# Patient Record
Sex: Male | Born: 1965 | Race: White | Hispanic: No | Smoking: Current every day smoker
Health system: Southern US, Community
[De-identification: ages and names within clinical notes are randomized; demographics above are authoritative.]

## PROBLEM LIST (undated history)

## (undated) DIAGNOSIS — F319 Bipolar disorder, unspecified: Secondary | ICD-10-CM

## (undated) DIAGNOSIS — I10 Essential (primary) hypertension: Secondary | ICD-10-CM

## (undated) DIAGNOSIS — E162 Hypoglycemia, unspecified: Secondary | ICD-10-CM

## (undated) HISTORY — PX: HERNIA REPAIR: SHX51

---

## 2008-12-18 ENCOUNTER — Emergency Department: Payer: Self-pay | Admitting: Emergency Medicine

## 2010-11-03 ENCOUNTER — Emergency Department: Payer: Self-pay | Admitting: Emergency Medicine

## 2011-06-16 ENCOUNTER — Emergency Department: Payer: Self-pay | Admitting: Emergency Medicine

## 2011-06-16 LAB — BASIC METABOLIC PANEL
Anion Gap: 10 (ref 7–16)
Calcium, Total: 9 mg/dL (ref 8.5–10.1)
Co2: 25 mmol/L (ref 21–32)
Creatinine: 1.13 mg/dL (ref 0.60–1.30)
EGFR (African American): >60
EGFR (Non-African Amer.): >60
Glucose: 128 mg/dL — ABNORMAL HIGH (ref 65–99)
Osmolality: 284 (ref 275–301)
Potassium: 3.8 mmol/L (ref 3.5–5.1)
Sodium: 141 mmol/L (ref 136–145)

## 2011-06-16 LAB — URINALYSIS, COMPLETE
Bilirubin,UR: NEGATIVE
Blood: NEGATIVE
Ketone: NEGATIVE
Nitrite: NEGATIVE
Ph: 7 (ref 4.5–8.0)
Specific Gravity: 1.006 (ref 1.003–1.030)
Squamous Epithelial: NONE SEEN

## 2011-06-16 LAB — CBC
HCT: 46.8 % (ref 40.0–52.0)
MCH: 29.6 pg (ref 26.0–34.0)
MCHC: 34.9 g/dL (ref 32.0–36.0)
MCV: 85 fL (ref 80–100)
Platelet: 209 10*3/uL (ref 150–440)
RDW: 13.8 % (ref 11.5–14.5)
WBC: 13.2 10*3/uL — ABNORMAL HIGH (ref 3.8–10.6)

## 2011-06-16 LAB — PROTIME-INR
INR: 0.9
Prothrombin Time: 12.2 secs (ref 11.5–14.7)

## 2011-06-16 LAB — APTT: Activated PTT: 28.4 secs (ref 23.6–35.9)

## 2014-01-22 ENCOUNTER — Emergency Department: Payer: Self-pay | Admitting: Emergency Medicine

## 2014-01-31 ENCOUNTER — Emergency Department: Payer: Self-pay | Admitting: Emergency Medicine

## 2015-02-08 ENCOUNTER — Emergency Department: Payer: Self-pay

## 2015-02-08 ENCOUNTER — Encounter: Payer: Self-pay | Admitting: Emergency Medicine

## 2015-02-08 ENCOUNTER — Emergency Department
Admission: EM | Admit: 2015-02-08 | Discharge: 2015-02-08 | Disposition: A | Discharge status: Home / Self Care | Payer: Self-pay | Attending: Emergency Medicine | Admitting: Emergency Medicine

## 2015-02-08 DIAGNOSIS — X58XXXA Exposure to other specified factors, initial encounter: Secondary | ICD-10-CM | POA: Insufficient documentation

## 2015-02-08 DIAGNOSIS — J069 Acute upper respiratory infection, unspecified: Secondary | ICD-10-CM | POA: Insufficient documentation

## 2015-02-08 DIAGNOSIS — Z720 Tobacco use: Secondary | ICD-10-CM | POA: Insufficient documentation

## 2015-02-08 DIAGNOSIS — Y9289 Other specified places as the place of occurrence of the external cause: Secondary | ICD-10-CM | POA: Insufficient documentation

## 2015-02-08 DIAGNOSIS — S29012A Strain of muscle and tendon of back wall of thorax, initial encounter: Secondary | ICD-10-CM | POA: Insufficient documentation

## 2015-02-08 DIAGNOSIS — T148XXA Other injury of unspecified body region, initial encounter: Secondary | ICD-10-CM

## 2015-02-08 DIAGNOSIS — Y9389 Activity, other specified: Secondary | ICD-10-CM | POA: Insufficient documentation

## 2015-02-08 DIAGNOSIS — Y998 Other external cause status: Secondary | ICD-10-CM | POA: Insufficient documentation

## 2015-02-08 HISTORY — DX: Bipolar disorder, unspecified: F31.9

## 2015-02-08 LAB — URINALYSIS COMPLETE WITH MICROSCOPIC (ARMC ONLY)
Bilirubin Urine: NEGATIVE
Glucose, UA: NEGATIVE mg/dL
HGB URINE DIPSTICK: NEGATIVE
KETONES UR: NEGATIVE mg/dL
Nitrite: NEGATIVE
PH: 5 (ref 5.0–8.0)
PROTEIN: NEGATIVE mg/dL
Specific Gravity, Urine: 1.025 (ref 1.005–1.030)

## 2015-02-08 MED ORDER — NAPROXEN 500 MG PO TABS: 500.0000 mg | ORAL_TABLET | Freq: Two times a day (BID) | ORAL | Status: DC

## 2015-02-08 MED ORDER — NAPROXEN 500 MG PO TABS
500.0000 mg | ORAL_TABLET | Freq: Once | ORAL | Status: AC
  Administered 2015-02-08: 500 mg via ORAL

## 2015-02-08 MED ORDER — NAPROXEN 500 MG PO TABS
ORAL_TABLET | ORAL | Status: AC
  Administered 2015-02-08: 500 mg via ORAL
  Filled 2015-02-08: qty 1

## 2015-02-08 NOTE — ED Notes (Signed)
Patient transported to X-ray 

## 2015-02-08 NOTE — ED Notes (Signed)
Pt reports cough and congestion x2 weeks, reports green sputum. Pt reports right flank pain started yesterday, denies hx of kidney stones and urinary symptoms.

## 2015-02-08 NOTE — ED Provider Notes (Signed)
[facility] Emergency Department Provider Note  ____________________________________________  Time seen: 3 PM  I have reviewed the triage vital signs and the nursing notes.   HISTORY  Chief Complaint URI and Flank Pain    HPI Thomas [name] is a 49 y.o. male who presents with cough and congestion for approximately 2 weeks. He says occasionally his sputum looks green but typically it is yellow. He denies fevers chills. No shortness of breath. No recent travel. No pleurisy. He also complains of new right mid back pain that is worse with movement. He feels he injured it from coughing. He has no abdominal pain. No nausea no vomiting     Past Medical History  Diagnosis Date  . Bipolar 1 disorder ([place])     There are no active problems to display for this patient.   Past Surgical History  Procedure Laterality Date  . Hernia repair      Current Outpatient Rx  Name  Route  Sig  Dispense  Refill  . naproxen (NAPROSYN) 500 MG tablet   Oral   Take 1 tablet (500 mg total) by mouth 2 (two) times daily with a meal.   20 tablet   2     Allergies Sulfa antibiotics  No family history on file.  Social History Social History  Substance Use Topics  . Smoking status: Current Every Day Smoker    Types: Cigarettes  . Smokeless tobacco: None  . Alcohol Use: No    Review of Systems  Constitutional: Negative for fever. Eyes: Negative for visual changes. ENT: Negative for sore throat Cardiovascular: Negative for chest pain. Respiratory: Negative for shortness of breath. For cough Gastrointestinal: Negative for abdominal pain, vomiting and diarrhea. Genitourinary: Negative for dysuria. Musculoskeletal: Positive for back pain Skin: Negative for rash. Neurological: Negative for headaches or focal weakness Psychiatric: No anxiety    ____________________________________________   PHYSICAL EXAM:  VITAL SIGNS: ED Triage Vitals  Enc Vitals Group      BP 02/08/15 1420 135/86 mmHg     Pulse Rate 02/08/15 1420 67     Resp 02/08/15 1420 20     Temp 02/08/15 1420 98 F (36.7 C)     Temp Source 02/08/15 1420 Oral     SpO2 02/08/15 1420 97 %     Weight 02/08/15 1420 199 lb (90.266 kg)     Height 02/08/15 1420 6' (1.829 m)     Head Cir --      Peak Flow --      Pain Score 02/08/15 1421 10     Pain Loc --      Pain Edu? --      Excl. in [place]? --      Constitutional: Alert and oriented. Well appearing and in no distress. Eyes: Conjunctivae are normal.  ENT   Head: Normocephalic and atraumatic.   Mouth/Throat: Mucous membranes are moist. Cardiovascular: Normal rate, regular rhythm. Normal and symmetric distal pulses are present in all extremities. No murmurs, rubs, or gallops. Respiratory: Normal respiratory effort without tachypnea nor retractions. Breath sounds are clear and equal bilaterally.  Gastrointestinal: Soft and non-tender in all quadrants. No distention. There is no CVA tenderness. Genitourinary: deferred Musculoskeletal: Nontender with normal range of motion in all extremities. No lower extremity tenderness nor edema. Patient with tenderness to palpation inferior to the right scapula consistent with muscle strain Neurologic:  Normal speech and language. No gross focal neurologic deficits are appreciated. Skin:  Skin is warm, dry and intact. No  rash noted. Psychiatric: Mood and affect are normal. Patient exhibits appropriate insight and judgment.  ____________________________________________    LABS (pertinent positives/negatives)  Labs Reviewed  URINALYSIS COMPLETEWITH MICROSCOPIC ([place] ONLY) - Abnormal; Notable for the following:    Color, Urine YELLOW (*)    APPearance CLEAR (*)    Leukocytes, UA TRACE (*)    Bacteria, UA RARE (*)    Squamous Epithelial / LPF 0-5 (*)    All other components within normal limits     ____________________________________________   EKG  None  ____________________________________________    RADIOLOGY I have personally reviewed any xrays that were ordered on this patient: Chest x-ray unremarkable  ____________________________________________   PROCEDURES  Procedure(s) performed: none  Critical Care performed: none  ____________________________________________   INITIAL IMPRESSION / ASSESSMENT AND PLAN / ED COURSE  Pertinent labs & imaging results that were available during my care of the patient were reviewed by me and considered in my medical decision making (see chart for details).  Patient with history of present illness consistent with upper respiratory infection now with muscle strain. Chest x-ray is unremarkable, vitals are normal. Exam is benign except for tenderness beneath the right scapula consistent with muscle strain. We will treat with NSAIDs and PCP follow-up  ____________________________________________   FINAL CLINICAL IMPRESSION(S) / ED DIAGNOSES  Final diagnoses:  Upper respiratory infection  Muscle strain     Lavonia Drafts, MD 02/08/15 1643

## 2015-02-08 NOTE — Discharge Instructions (Signed)
Upper Respiratory Infection, Adult Most upper respiratory infections (URIs) are a viral infection of the air passages leading to the lungs. A URI affects the nose, throat, and upper air passages. The most common type of URI is nasopharyngitis and is typically referred to as "the common cold." URIs run their course and usually go away on their own. Most of the time, a URI does not require medical attention, but sometimes a bacterial infection in the upper airways can follow a viral infection. This is called a secondary infection. Sinus and middle ear infections are common types of secondary upper respiratory infections. Bacterial pneumonia can also complicate a URI. A URI can worsen asthma and chronic obstructive pulmonary disease (COPD). Sometimes, these complications can require emergency medical care and may be life threatening.  CAUSES Almost all URIs are caused by viruses. A virus is a type of germ and can spread from one person to another.  RISKS FACTORS You may be at risk for a URI if:   You smoke.   You have chronic heart or lung disease.  You have a weakened defense (immune) system.   You are very young or very old.   You have nasal allergies or asthma.  You work in crowded or poorly ventilated areas.  You work in health care facilities or schools. SIGNS AND SYMPTOMS  Symptoms typically develop 2-3 days after you come in contact with a cold virus. Most viral URIs last 7-10 days. However, viral URIs from the influenza virus (flu virus) can last 14-18 days and are typically more severe. Symptoms may include:   Runny or stuffy (congested) nose.   Sneezing.   Cough.   Sore throat.   Headache.   Fatigue.   Fever.   Loss of appetite.   Pain in your forehead, behind your eyes, and over your cheekbones (sinus pain).  Muscle aches.  DIAGNOSIS  Your health care provider may diagnose a URI by:  Physical exam.  Tests to check that your symptoms are not due to  another condition such as:  Strep throat.  Sinusitis.  Pneumonia.  Asthma. TREATMENT  A URI goes away on its own with time. It cannot be cured with medicines, but medicines may be prescribed or recommended to relieve symptoms. Medicines may help:  Reduce your fever.  Reduce your cough.  Relieve nasal congestion. HOME CARE INSTRUCTIONS   Take medicines only as directed by your health care provider.   Gargle warm saltwater or take cough drops to comfort your throat as directed by your health care provider.  Use a warm mist humidifier or inhale steam from a shower to increase air moisture. This may make it easier to breathe.  Drink enough fluid to keep your urine clear or pale yellow.   Eat soups and other clear broths and maintain good nutrition.   Rest as needed.   Return to work when your temperature has returned to normal or as your health care provider advises. You may need to stay home longer to avoid infecting others. You can also use a face mask and careful hand washing to prevent spread of the virus.  Increase the usage of your inhaler if you have asthma.   Do not use any tobacco products, including cigarettes, chewing tobacco, or electronic cigarettes. If you need help quitting, ask your health care provider. PREVENTION  The best way to protect yourself from getting a cold is to practice good hygiene.   Avoid oral or hand contact with people with cold   symptoms.   Wash your hands often if contact occurs.  There is no clear evidence that vitamin C, vitamin E, echinacea, or exercise reduces the chance of developing a cold. However, it is always recommended to get plenty of rest, exercise, and practice good nutrition.  SEEK MEDICAL CARE IF:   You are getting worse rather than better.   Your symptoms are not controlled by medicine.   You have chills.  You have worsening shortness of breath.  You have brown or red mucus.  You have yellow or brown nasal  discharge.  You have pain in your face, especially when you bend forward.  You have a fever.  You have swollen neck glands.  You have pain while swallowing.  You have white areas in the back of your throat. SEEK IMMEDIATE MEDICAL CARE IF:   You have severe or persistent:  Headache.  Ear pain.  Sinus pain.  Chest pain.  You have chronic lung disease and any of the following:  Wheezing.  Prolonged cough.  Coughing up blood.  A change in your usual mucus.  You have a stiff neck.  You have changes in your:  Vision.  Hearing.  Thinking.  Mood. MAKE SURE YOU:   Understand these instructions.  Will watch your condition.  Will get help right away if you are not doing well or get worse.   This information is not intended to replace advice given to you by your health care provider. Make sure you discuss any questions you have with your health care provider.   Document Released: 10/12/2000 Document Revised: 09/02/2014 Document Reviewed: 07/24/2013 Elsevier Interactive Patient Education 2016 [employer]  

## 2015-02-08 NOTE — ED Notes (Signed)
MD Kinner at bedside  

## 2015-05-20 ENCOUNTER — Encounter: Payer: Self-pay | Admitting: Urgent Care

## 2015-05-20 DIAGNOSIS — F17210 Nicotine dependence, cigarettes, uncomplicated: Secondary | ICD-10-CM | POA: Insufficient documentation

## 2015-05-20 DIAGNOSIS — K1379 Other lesions of oral mucosa: Secondary | ICD-10-CM | POA: Insufficient documentation

## 2015-05-20 DIAGNOSIS — H6692 Otitis media, unspecified, left ear: Secondary | ICD-10-CM | POA: Insufficient documentation

## 2015-05-20 DIAGNOSIS — Z791 Long term (current) use of non-steroidal anti-inflammatories (NSAID): Secondary | ICD-10-CM | POA: Diagnosis not present

## 2015-05-20 DIAGNOSIS — H9202 Otalgia, left ear: Secondary | ICD-10-CM | POA: Diagnosis present

## 2015-05-20 MED ORDER — DEXAMETHASONE 1 MG/ML PO CONC: 10.0000 mg | Freq: Once | ORAL | Status: DC

## 2015-05-20 MED ORDER — HYDROCODONE-ACETAMINOPHEN 5-325 MG PO TABS
2.0000 | ORAL_TABLET | Freq: Once | ORAL | Status: AC
  Administered 2015-05-21: 2 via ORAL
  Filled 2015-05-20: qty 2

## 2015-05-20 NOTE — ED Notes (Signed)
Patient presents with c/o a LEFT otalgia x 2 days. Pain and swelling has progressed to where he feels like his throat is swelling - states, "It feels like something is hanging down back there". Patient noted to have patent airway and is able to maintain his oral secretions without difficulties. There is no reported changes in the quality of patient's voice. No increased WOB noted in triage. Patient advising that he has had a fever; unable to report tmax.

## 2015-05-21 ENCOUNTER — Emergency Department
Admission: EM | Admit: 2015-05-21 | Discharge: 2015-05-21 | Disposition: A | Discharge status: Home / Self Care | Payer: Medicaid Other | Attending: Emergency Medicine | Admitting: Emergency Medicine

## 2015-05-21 DIAGNOSIS — H6692 Otitis media, unspecified, left ear: Secondary | ICD-10-CM

## 2015-05-21 MED ORDER — AMOXICILLIN-POT CLAVULANATE 875-125 MG PO TABS
1.0000 | ORAL_TABLET | Freq: Once | ORAL | Status: AC
  Administered 2015-05-21: 1 via ORAL
  Filled 2015-05-21: qty 1

## 2015-05-21 MED ORDER — DEXAMETHASONE SODIUM PHOSPHATE 10 MG/ML IJ SOLN
10.0000 mg | Freq: Once | INTRAMUSCULAR | Status: AC
  Administered 2015-05-21: 10 mg via INTRAVENOUS

## 2015-05-21 MED ORDER — IBUPROFEN 800 MG PO TABS
800.0000 mg | ORAL_TABLET | Freq: Once | ORAL | Status: AC
  Administered 2015-05-21: 800 mg via ORAL
  Filled 2015-05-21: qty 1

## 2015-05-21 MED ORDER — AMOXICILLIN-POT CLAVULANATE 875-125 MG PO TABS: 1.0000 | ORAL_TABLET | Freq: Two times a day (BID) | ORAL | Status: AC

## 2015-05-21 MED ORDER — DEXAMETHASONE SODIUM PHOSPHATE 10 MG/ML IJ SOLN
INTRAMUSCULAR | Status: AC
  Administered 2015-05-21: 10 mg via INTRAVENOUS
  Filled 2015-05-21: qty 1

## 2015-05-21 NOTE — ED Notes (Signed)
Patient medicated in triage with steroids and pain medication for reported symptoms. Patient also reassessed at this time. While there is slight swelling noted, it is not significant when compared contralaterally. Patient continues to maintain a patent airway; BBS CTA; able to handle oral secretions without difficulties. Sats 98% on RA. Patient advised to make stat desk staff aware if symptoms worsen while in the lobby; verbalized understanding.

## 2015-05-21 NOTE — Discharge Instructions (Signed)

## 2015-05-21 NOTE — ED Provider Notes (Signed)
[facility] Emergency Department Provider Note  ____________________________________________  Time seen: 3:45 AM  I have reviewed the triage vital signs and the nursing notes.   HISTORY  Chief Complaint Otalgia and Fever    HPI Thomas [name] is a 50 y.o. male presents with left earache 2 days accompanied by a "feels like something is hanging in the back of my throat". Patient states that he's had a fever at home however afebrile here at 98.4 and presentation. Patient admits to smoking 1 1/2-2 packs of cigarettes per day     Past Medical History  Diagnosis Date  . Bipolar 1 disorder ([place])     There are no active problems to display for this patient.   Past Surgical History  Procedure Laterality Date  . Hernia repair      Current Outpatient Rx  Name  Route  Sig  Dispense  Refill  . naproxen (NAPROSYN) 500 MG tablet   Oral   Take 1 tablet (500 mg total) by mouth 2 (two) times daily with a meal.   20 tablet   2     Allergies Sulfa antibiotics  No family history on file.  Social History Social History  Substance Use Topics  . Smoking status: Current Every Day Smoker    Types: Cigarettes  . Smokeless tobacco: None  . Alcohol Use: No    Review of Systems  Constitutional: Positive for fever. Eyes: Negative for visual changes. ENT: Negative for sore throat. Positive for left earache Cardiovascular: Negative for chest pain. Respiratory: Negative for shortness of breath. Gastrointestinal: Negative for abdominal pain, vomiting and diarrhea. Genitourinary: Negative for dysuria. Musculoskeletal: Negative for back pain. Skin: Negative for rash. Neurological: Negative for headaches, focal weakness or numbness.   10-point ROS otherwise negative.  ____________________________________________   PHYSICAL EXAM:  VITAL SIGNS: ED Triage Vitals  Enc Vitals Group     BP 05/20/15 2346 151/89 mmHg     Pulse Rate 05/20/15 2346 68     Resp  05/20/15 2346 18     Temp 05/20/15 2346 98.4 F (36.9 C)     Temp Source 05/20/15 2346 Oral     SpO2 05/20/15 2346 98 %     Weight 05/20/15 2346 201 lb (91.173 kg)     Height 05/20/15 2346 6' (1.829 m)     Head Cir --      Peak Flow --      Pain Score 05/20/15 2346 10     Pain Loc --      Pain Edu? --      Excl. in [place]? --      Constitutional: Alert and oriented. Well appearing and in no distress. Eyes: Conjunctivae are normal. PERRL. Normal extraocular movements. ENT   Head: Normocephalic and atraumatic.   Nose: No congestion/rhinnorhea.   Mouth/Throat: Mucous membranes are moist. Uvula hydrops   Neck: No stridor. Ears: Left TM erythema with bulging and cloudy fluid noted posteriorly Hematological/Lymphatic/Immunilogical: No cervical lymphadenopathy. Cardiovascular: Normal rate, regular rhythm. Normal and symmetric distal pulses are present in all extremities. No murmurs, rubs, or gallops. Respiratory: Normal respiratory effort without tachypnea nor retractions. Breath sounds are clear and equal bilaterally. No wheezes/rales/rhonchi. Gastrointestinal: Soft and nontender. No distention. There is no CVA tenderness. Genitourinary: deferred Musculoskeletal: Nontender with normal range of motion in all extremities. No joint effusions.  No lower extremity tenderness nor edema. Neurologic:  Normal speech and language. No gross focal neurologic deficits are appreciated. Speech is normal.  Skin:  Skin is warm, dry and intact. No rash noted. Psychiatric: Mood and affect are normal. Speech and behavior are normal. Patient exhibits appropriate insight and judgment.    INITIAL IMPRESSION / ASSESSMENT AND PLAN / ED COURSE  Pertinent labs & imaging results that were available during my care of the patient were reviewed by me and considered in my medical decision making (see chart for details).  History of physical exam consistent with left otitis media as such patient received  Augmentin 875 mg will be prescribed same at home. Patient also noted to have uvular hydrops  ____________________________________________   FINAL CLINICAL IMPRESSION(S) / ED DIAGNOSES  Final diagnoses:  Acute left otitis media, recurrence not specified, unspecified otitis media type  Uvula hydrops    Gregor Hams, MD 05/21/15 0430

## 2015-05-22 ENCOUNTER — Observation Stay
Admission: EM | Admit: 2015-05-22 | Discharge: 2015-05-23 | Disposition: A | Discharge status: Home / Self Care | Payer: Medicaid Other | Attending: Internal Medicine | Admitting: Internal Medicine

## 2015-05-22 ENCOUNTER — Encounter
Admission: EM | Disposition: A | Discharge status: Home / Self Care | Payer: Self-pay | Source: Home / Self Care | Attending: Internal Medicine

## 2015-05-22 ENCOUNTER — Inpatient Hospital Stay: Payer: Medicaid Other | Admitting: Certified Registered"

## 2015-05-22 ENCOUNTER — Emergency Department: Payer: Medicaid Other

## 2015-05-22 DIAGNOSIS — R030 Elevated blood-pressure reading, without diagnosis of hypertension: Secondary | ICD-10-CM | POA: Diagnosis not present

## 2015-05-22 DIAGNOSIS — Z882 Allergy status to sulfonamides status: Secondary | ICD-10-CM | POA: Insufficient documentation

## 2015-05-22 DIAGNOSIS — R509 Fever, unspecified: Secondary | ICD-10-CM | POA: Insufficient documentation

## 2015-05-22 DIAGNOSIS — Z9889 Other specified postprocedural states: Secondary | ICD-10-CM | POA: Insufficient documentation

## 2015-05-22 DIAGNOSIS — R1310 Dysphagia, unspecified: Secondary | ICD-10-CM | POA: Insufficient documentation

## 2015-05-22 DIAGNOSIS — F17210 Nicotine dependence, cigarettes, uncomplicated: Secondary | ICD-10-CM | POA: Diagnosis not present

## 2015-05-22 DIAGNOSIS — Z8249 Family history of ischemic heart disease and other diseases of the circulatory system: Secondary | ICD-10-CM | POA: Insufficient documentation

## 2015-05-22 DIAGNOSIS — F319 Bipolar disorder, unspecified: Secondary | ICD-10-CM | POA: Diagnosis not present

## 2015-05-22 DIAGNOSIS — J36 Peritonsillar abscess: Principal | ICD-10-CM | POA: Diagnosis present

## 2015-05-22 DIAGNOSIS — J390 Retropharyngeal and parapharyngeal abscess: Secondary | ICD-10-CM

## 2015-05-22 DIAGNOSIS — D72829 Elevated white blood cell count, unspecified: Secondary | ICD-10-CM | POA: Insufficient documentation

## 2015-05-22 HISTORY — PX: INCISION AND DRAINAGE OF PERITONSILLAR ABCESS: SHX6257

## 2015-05-22 LAB — COMPREHENSIVE METABOLIC PANEL
ALBUMIN: 3.9 g/dL (ref 3.5–5.0)
ALT: 14 U/L — ABNORMAL LOW (ref 17–63)
AST: 12 U/L — AB (ref 15–41)
Alkaline Phosphatase: 79 U/L (ref 38–126)
Anion gap: 8 (ref 5–15)
BILIRUBIN TOTAL: 0.5 mg/dL (ref 0.3–1.2)
BUN: 15 mg/dL (ref 6–20)
CO2: 25 mmol/L (ref 22–32)
Calcium: 9.1 mg/dL (ref 8.9–10.3)
Chloride: 102 mmol/L (ref 101–111)
Creatinine, Ser: 1.01 mg/dL (ref 0.61–1.24)
GFR calc Af Amer: >60 mL/min (ref >60)
GFR calc non Af Amer: >60 mL/min (ref >60)
GLUCOSE: 124 mg/dL — AB (ref 65–99)
POTASSIUM: 3.2 mmol/L — AB (ref 3.5–5.1)
Sodium: 135 mmol/L (ref 135–145)
TOTAL PROTEIN: 7.9 g/dL (ref 6.5–8.1)

## 2015-05-22 LAB — CBC
HEMATOCRIT: 46.4 % (ref 40.0–52.0)
HEMOGLOBIN: 15.4 g/dL (ref 13.0–18.0)
MCH: 26.6 pg (ref 26.0–34.0)
MCHC: 33.2 g/dL (ref 32.0–36.0)
MCV: 80.1 fL (ref 80.0–100.0)
Platelets: 208 10*3/uL (ref 150–440)
RBC: 5.79 MIL/uL (ref 4.40–5.90)
RDW: 14.7 % — AB (ref 11.5–14.5)
WBC: 18.9 10*3/uL — AB (ref 3.8–10.6)

## 2015-05-22 SURGERY — INCISION AND DRAINAGE, ABSCESS, PERITONSILLAR
Anesthesia: General | Wound class: Dirty or Infected

## 2015-05-22 MED ORDER — ONDANSETRON HCL 4 MG/2ML IJ SOLN
INTRAMUSCULAR | Status: DC | PRN
  Administered 2015-05-22: 4 mg via INTRAVENOUS

## 2015-05-22 MED ORDER — MORPHINE SULFATE (PF) 2 MG/ML IV SOLN: 2.0000 mg | INTRAVENOUS | Status: DC | PRN

## 2015-05-22 MED ORDER — CLINDAMYCIN PHOSPHATE 900 MG/50ML IV SOLN
900.0000 mg | Freq: Once | INTRAVENOUS | Status: AC
  Administered 2015-05-22: 900 mg via INTRAVENOUS
  Filled 2015-05-22: qty 50

## 2015-05-22 MED ORDER — MORPHINE SULFATE (PF) 4 MG/ML IV SOLN
INTRAVENOUS | Status: AC
  Administered 2015-05-22: 4 mg via INTRAVENOUS
  Filled 2015-05-22: qty 1

## 2015-05-22 MED ORDER — VENLAFAXINE HCL ER 75 MG PO CP24: 75.0000 mg | ORAL_CAPSULE | Freq: Every morning | ORAL | Status: DC

## 2015-05-22 MED ORDER — ACETAMINOPHEN 160 MG/5ML PO SOLN
650.0000 mg | ORAL | Status: DC | PRN
  Filled 2015-05-22: qty 20.3

## 2015-05-22 MED ORDER — HYDROCODONE-ACETAMINOPHEN 7.5-325 MG/15ML PO SOLN: 10.0000 mL | ORAL | Status: DC | PRN

## 2015-05-22 MED ORDER — SUGAMMADEX SODIUM 200 MG/2ML IV SOLN
INTRAVENOUS | Status: DC | PRN
  Administered 2015-05-22: 180 mg via INTRAVENOUS

## 2015-05-22 MED ORDER — SODIUM CHLORIDE 0.9 % IV SOLN
3.0000 g | Freq: Four times a day (QID) | INTRAVENOUS | Status: DC
  Administered 2015-05-22 – 2015-05-23 (×3): 3 g via INTRAVENOUS
  Filled 2015-05-22 (×6): qty 3

## 2015-05-22 MED ORDER — DEXAMETHASONE SODIUM PHOSPHATE 10 MG/ML IJ SOLN: 10.0000 mg | Freq: Three times a day (TID) | INTRAMUSCULAR | Status: DC

## 2015-05-22 MED ORDER — ONDANSETRON HCL 4 MG/2ML IJ SOLN
4.0000 mg | Freq: Once | INTRAMUSCULAR | Status: AC
  Administered 2015-05-22: 4 mg via INTRAVENOUS
  Filled 2015-05-22: qty 2

## 2015-05-22 MED ORDER — HYDROMORPHONE HCL 1 MG/ML IJ SOLN
0.2500 mg | INTRAMUSCULAR | Status: DC | PRN
  Administered 2015-05-22 (×2): 0.25 mg via INTRAVENOUS

## 2015-05-22 MED ORDER — ONDANSETRON HCL 4 MG/2ML IJ SOLN: 4.0000 mg | Freq: Once | INTRAMUSCULAR | Status: DC | PRN

## 2015-05-22 MED ORDER — SUCCINYLCHOLINE CHLORIDE 20 MG/ML IJ SOLN
INTRAMUSCULAR | Status: DC | PRN
  Administered 2015-05-22: 40 mg via INTRAVENOUS
  Administered 2015-05-22: 100 mg via INTRAVENOUS

## 2015-05-22 MED ORDER — LIDOCAINE HCL (CARDIAC) 20 MG/ML IV SOLN
INTRAVENOUS | Status: DC | PRN
  Administered 2015-05-22: 60 mg via INTRAVENOUS

## 2015-05-22 MED ORDER — FENTANYL CITRATE (PF) 100 MCG/2ML IJ SOLN: 25.0000 ug | INTRAMUSCULAR | Status: DC | PRN

## 2015-05-22 MED ORDER — MORPHINE SULFATE (PF) 2 MG/ML IV SOLN
2.0000 mg | Freq: Once | INTRAVENOUS | Status: AC
  Administered 2015-05-22: 2 mg via INTRAVENOUS

## 2015-05-22 MED ORDER — ONDANSETRON HCL 4 MG PO TABS
4.0000 mg | ORAL_TABLET | Freq: Four times a day (QID) | ORAL | Status: DC | PRN
Start: 2015-05-22 — End: 2015-05-23

## 2015-05-22 MED ORDER — KCL IN DEXTROSE-NACL 20-5-0.45 MEQ/L-%-% IV SOLN
INTRAVENOUS | Status: DC
  Administered 2015-05-22 – 2015-05-23 (×2): via INTRAVENOUS
  Filled 2015-05-22 (×5): qty 1000

## 2015-05-22 MED ORDER — LABETALOL HCL 5 MG/ML IV SOLN
5.0000 mg | Freq: Once | INTRAVENOUS | Status: AC
  Administered 2015-05-22: 5 mg via INTRAVENOUS

## 2015-05-22 MED ORDER — ONDANSETRON HCL 4 MG/2ML IJ SOLN: 4.0000 mg | Freq: Four times a day (QID) | INTRAMUSCULAR | Status: DC | PRN

## 2015-05-22 MED ORDER — MORPHINE SULFATE (PF) 2 MG/ML IV SOLN
INTRAVENOUS | Status: AC
  Filled 2015-05-22: qty 1

## 2015-05-22 MED ORDER — LABETALOL HCL 5 MG/ML IV SOLN
INTRAVENOUS | Status: AC
  Administered 2015-05-22: 5 mg via INTRAVENOUS
  Filled 2015-05-22: qty 4

## 2015-05-22 MED ORDER — HYDROMORPHONE HCL 1 MG/ML IJ SOLN
INTRAMUSCULAR | Status: AC
  Administered 2015-05-22: 0.25 mg via INTRAVENOUS
  Filled 2015-05-22: qty 1

## 2015-05-22 MED ORDER — ACETAMINOPHEN 650 MG RE SUPP
650.0000 mg | Freq: Four times a day (QID) | RECTAL | Status: DC | PRN
Start: 2015-05-22 — End: 2015-05-23

## 2015-05-22 MED ORDER — DOCUSATE SODIUM 100 MG PO CAPS
100.0000 mg | ORAL_CAPSULE | Freq: Two times a day (BID) | ORAL | Status: DC
  Administered 2015-05-22 – 2015-05-23 (×2): 100 mg via ORAL
  Filled 2015-05-22 (×2): qty 1

## 2015-05-22 MED ORDER — PROPOFOL 10 MG/ML IV BOLUS
INTRAVENOUS | Status: DC | PRN
  Administered 2015-05-22: 170 mg via INTRAVENOUS

## 2015-05-22 MED ORDER — FENTANYL CITRATE (PF) 100 MCG/2ML IJ SOLN
INTRAMUSCULAR | Status: DC | PRN
  Administered 2015-05-22 (×2): 50 ug via INTRAVENOUS

## 2015-05-22 MED ORDER — CLINDAMYCIN PHOSPHATE 600 MG/50ML IV SOLN
600.0000 mg | Freq: Three times a day (TID) | INTRAVENOUS | Status: DC
  Administered 2015-05-22 – 2015-05-23 (×3): 600 mg via INTRAVENOUS
  Filled 2015-05-22 (×5): qty 50

## 2015-05-22 MED ORDER — ONDANSETRON HCL 4 MG/5ML PO SOLN
4.0000 mg | Freq: Four times a day (QID) | ORAL | Status: DC | PRN
  Filled 2015-05-22: qty 5

## 2015-05-22 MED ORDER — DEXAMETHASONE SODIUM PHOSPHATE 10 MG/ML IJ SOLN
10.0000 mg | Freq: Once | INTRAMUSCULAR | Status: AC
  Administered 2015-05-22: 10 mg via INTRAVENOUS
  Filled 2015-05-22: qty 1

## 2015-05-22 MED ORDER — DEXAMETHASONE SODIUM PHOSPHATE 10 MG/ML IJ SOLN
10.0000 mg | Freq: Three times a day (TID) | INTRAMUSCULAR | Status: DC
  Administered 2015-05-22 – 2015-05-23 (×3): 10 mg via INTRAVENOUS
  Filled 2015-05-22 (×3): qty 1

## 2015-05-22 MED ORDER — OXYCODONE HCL 5 MG PO TABS
5.0000 mg | ORAL_TABLET | ORAL | Status: DC | PRN
  Administered 2015-05-22: 5 mg via ORAL
  Filled 2015-05-22: qty 1

## 2015-05-22 MED ORDER — ROCURONIUM BROMIDE 100 MG/10ML IV SOLN
INTRAVENOUS | Status: DC | PRN
  Administered 2015-05-22: 10 mg via INTRAVENOUS

## 2015-05-22 MED ORDER — VENLAFAXINE HCL ER 75 MG PO CP24
75.0000 mg | ORAL_CAPSULE | Freq: Two times a day (BID) | ORAL | Status: DC
  Administered 2015-05-22 – 2015-05-23 (×2): 75 mg via ORAL
  Filled 2015-05-22 (×2): qty 1

## 2015-05-22 MED ORDER — MORPHINE SULFATE (PF) 2 MG/ML IV SOLN
2.0000 mg | Freq: Once | INTRAVENOUS | Status: AC
  Administered 2015-05-22: 2 mg via INTRAVENOUS
  Filled 2015-05-22: qty 1

## 2015-05-22 MED ORDER — MIDAZOLAM HCL 5 MG/5ML IJ SOLN
INTRAMUSCULAR | Status: DC | PRN
  Administered 2015-05-22 (×2): 1 mg via INTRAVENOUS

## 2015-05-22 MED ORDER — POLYETHYLENE GLYCOL 3350 17 G PO PACK: 17.0000 g | PACK | Freq: Every day | ORAL | Status: DC | PRN

## 2015-05-22 MED ORDER — MORPHINE SULFATE (PF) 4 MG/ML IV SOLN
4.0000 mg | Freq: Once | INTRAVENOUS | Status: AC
  Administered 2015-05-22: 4 mg via INTRAVENOUS

## 2015-05-22 MED ORDER — GLYCOPYRROLATE 0.2 MG/ML IJ SOLN
INTRAMUSCULAR | Status: DC | PRN
  Administered 2015-05-22: 0.4 mg via INTRAVENOUS

## 2015-05-22 MED ORDER — CLINDAMYCIN PHOSPHATE 600 MG/50ML IV SOLN
INTRAVENOUS | Status: AC
  Filled 2015-05-22: qty 50

## 2015-05-22 MED ORDER — ACETAMINOPHEN 325 MG RE SUPP
325.0000 mg | RECTAL | Status: DC | PRN
  Filled 2015-05-22: qty 1

## 2015-05-22 MED ORDER — ENOXAPARIN SODIUM 40 MG/0.4ML ~~LOC~~ SOLN: 40.0000 mg | SUBCUTANEOUS | Status: DC

## 2015-05-22 MED ORDER — SODIUM CHLORIDE 0.9 % IV SOLN
INTRAVENOUS | Status: DC
  Administered 2015-05-22 (×2): via INTRAVENOUS

## 2015-05-22 MED ORDER — ACETAMINOPHEN 325 MG PO TABS
650.0000 mg | ORAL_TABLET | Freq: Four times a day (QID) | ORAL | Status: DC | PRN
  Administered 2015-05-23: 650 mg via ORAL
  Filled 2015-05-22: qty 2

## 2015-05-22 MED ORDER — IOHEXOL 300 MG/ML  SOLN
75.0000 mL | Freq: Once | INTRAMUSCULAR | Status: AC | PRN
  Administered 2015-05-22: 75 mL via INTRAVENOUS

## 2015-05-22 MED ORDER — DEXAMETHASONE SODIUM PHOSPHATE 10 MG/ML IJ SOLN
INTRAMUSCULAR | Status: DC | PRN
  Administered 2015-05-22: 5 mg via INTRAVENOUS

## 2015-05-22 MED ORDER — MORPHINE SULFATE (PF) 2 MG/ML IV SOLN: 1.0000 mg | INTRAVENOUS | Status: DC | PRN

## 2015-05-22 SURGICAL SUPPLY — 12 items
CANISTER SUCT 1200ML W/VALVE (MISCELLANEOUS) ×3 IMPLANT
ELECT CAUTERY BLADE TIP 2.5 (TIP) ×3
ELECT REM PT RETURN 9FT ADLT (ELECTROSURGICAL) ×3
ELECTRODE CAUTERY BLDE TIP 2.5 (TIP) ×1 IMPLANT
ELECTRODE REM PT RTRN 9FT ADLT (ELECTROSURGICAL) ×1 IMPLANT
GLOVE BIO SURGEON STRL SZ7.5 (GLOVE) ×3 IMPLANT
HANDLE SUCTION POOLE (INSTRUMENTS) ×1 IMPLANT
NDL SAFETY 18GX1.5 (NEEDLE) ×3 IMPLANT
NS IRRIG 500ML POUR BTL (IV SOLUTION) ×3 IMPLANT
PACK HEAD/NECK (MISCELLANEOUS) ×3 IMPLANT
SUCTION POOLE HANDLE (INSTRUMENTS) ×3
SWAB CULTURE AMIES ANAERIB BLU (MISCELLANEOUS) ×3 IMPLANT

## 2015-05-22 NOTE — ED Provider Notes (Signed)
[facility] Emergency Department Provider Note  ____________________________________________  Time seen: 5:20 AM  I have reviewed the triage vital signs and the nursing notes.   HISTORY  Chief Complaint Otalgia      HPI Thomas [name] is a 50 y.o. male presents with worsening left ear/throat pain. Patient was seen day prior and diagnosed with left otitis media and pharyngitis for which she was prescribed Augmentin. Patient was advised to return to emergency department if his pain was to worsen or change in speech which prompted his visit morning.     Past Medical History  Diagnosis Date  . Bipolar 1 disorder ([place])     There are no active problems to display for this patient.   Past Surgical History  Procedure Laterality Date  . Hernia repair      Current Outpatient Rx  Name  Route  Sig  Dispense  Refill  . amoxicillin-clavulanate (AUGMENTIN) 875-125 MG tablet   Oral   Take 1 tablet by mouth 2 (two) times daily.   20 tablet   0   . naproxen (NAPROSYN) 500 MG tablet   Oral   Take 1 tablet (500 mg total) by mouth 2 (two) times daily with a meal.   20 tablet   2     Allergies Sulfa antibiotics  No family history on file.  Social History Social History  Substance Use Topics  . Smoking status: Current Every Day Smoker    Types: Cigarettes  . Smokeless tobacco: Not on file  . Alcohol Use: No    Review of Systems  Constitutional: Negative for fever. Eyes: Negative for visual changes. ENT: Positive for sore throat, left earache Cardiovascular: Negative for chest pain. Respiratory: Negative for shortness of breath. Gastrointestinal: Negative for abdominal pain, vomiting and diarrhea. Genitourinary: Negative for dysuria. Musculoskeletal: Negative for back pain. Skin: Negative for rash. Neurological: Negative for headaches, focal weakness or numbness.   10-point ROS otherwise  negative.  ____________________________________________   PHYSICAL EXAM:  VITAL SIGNS: ED Triage Vitals  Enc Vitals Group     BP 05/22/15 0501 146/89 mmHg     Pulse Rate 05/22/15 0501 66     Resp 05/22/15 0501 18     Temp 05/22/15 0501 98 F (36.7 C)     Temp Source 05/22/15 0501 Oral     SpO2 05/22/15 0501 98 %     Weight 05/22/15 0501 205 lb (92.987 kg)     Height 05/22/15 0501 6' (1.829 m)     Head Cir --      Peak Flow --      Pain Score 05/22/15 0501 10     Pain Loc --      Pain Edu? --      Excl. in [place]? --      Constitutional: Alert and oriented. Well appearing and in no distress. Eyes: Conjunctivae are normal. PERRL. Normal extraocular movements. ENT   Head: Normocephalic and atraumatic.   Nose: No congestion/rhinnorhea.   Mouth/Throat: Mucous membranes are moist. Pharyngeal erythema and large tonsil on the left   Neck: No stridor. Anterior cervical lymphadenopathy Hematological/Lymphatic/Immunilogical: No cervical lymphadenopathy. Cardiovascular: Normal rate, regular rhythm. Normal and symmetric distal pulses are present in all extremities. No murmurs, rubs, or gallops. Respiratory: Normal respiratory effort without tachypnea nor retractions. Breath sounds are clear and equal bilaterally. No wheezes/rales/rhonchi. Gastrointestinal: Soft and nontender. No distention. There is no CVA tenderness. Genitourinary: deferred Musculoskeletal: Nontender with normal range of motion in all extremities. No  joint effusions.  No lower extremity tenderness nor edema. Neurologic:  Normal speech and language. No gross focal neurologic deficits are appreciated. Speech is normal.  Skin:  Skin is warm, dry and intact. No rash noted. Psychiatric: Mood and affect are normal. Speech and behavior are normal. Patient exhibits appropriate insight and judgment.  ____________________________________________    LABS (pertinent positives/negatives)  Labs Reviewed  CBC -  Abnormal; Notable for the following:    WBC 18.9 (*)    RDW 14.7 (*)    All other components within normal limits  COMPREHENSIVE METABOLIC PANEL - Abnormal; Notable for the following:    Potassium 3.2 (*)    Glucose, Bld 124 (*)    AST 12 (*)    ALT 14 (*)    All other components within normal limits       RADIOLOGY   CT Soft Tissue Neck W Contrast (Final result) Result time: 05/22/15 07:23:04   Final result by Rad Results In Interface (05/22/15 07:23:04)   Narrative:   CLINICAL DATA: Left tonsillar swelling and vocal difficulty  EXAM: CT NECK WITH CONTRAST  TECHNIQUE: Multidetector CT imaging of the neck was performed using the standard protocol following the bolus administration of intravenous contrast.  CONTRAST: 58mL OMNIPAQUE IOHEXOL 300 MG/ML SOLN  COMPARISON: None.  FINDINGS: Pharynx and larynx: There is a lesion arising in the left peritonsillar area measuring 4.5 x 3.0 x 2.5 cm which is causing impression on the upper pharyngeal airway with slight deviation of the upper pharyngeal airway to the right. The borders of this area are ill-defined with a relative decreased attenuation central area and a thick peripheral area of attenuation and similar to the surrounding soft tissues. In addition, there is effacement of the left piriform sinus with a similar appearing area of decreased central region with a thinner area of slightly higher attenuation measuring 1.4 x 1.1 x 1.0 cm. No laryngeal mass or inflammation is seen. The epiglottis and aryepiglottic folds appear normal. Tongue and tongue base regions appear normal. The prevertebral soft tissues are normal. There remainder of the pharyngeal and visualized tracheal air column appear normal.  Salivary glands: Salivary glands appear symmetric and normal bilaterally.  Thyroid: There is a 4 x 4 mm focus of decreased attenuation in the left lobe of the thyroid. Thyroid otherwise appears normal.  Lymph  nodes: There is a 1.3 x 1.1 cm lymph node just posterior to the left jugular vein and deep to the sternocleidomastoid muscle at the level of C5. No other lymph node prominence is appreciable on this study.  Orbits: Only the inferior orbits are visualized. The visualized intraorbital regions appear symmetric and normal bilaterally.  Mastoids and visualized paranasal sinuses: There is a small retention cyst in the inferior posterior left maxillary antrum. Visualized paranasal sinuses otherwise appear clear. Mastoids are clear bilaterally.  Skeleton: There is degenerative change in the cervical spine. There are no blastic or lytic bone lesions.  There is mild bullous disease in the apices bilaterally. There is no mass or infiltrate in the visualized upper lung zone regions. There is no adenopathy in the visualized upper thoracic region.  IMPRESSION: There is a focal lesion arising from the left tonsil causing mild rightward deviation of the upper pharyngeal air column. This area has a lucent central area with a thick peripheral region measuring 4.5 x 3.0 x 2.5 cm. Suspect peritonsillar abscess, although neoplasm is a differential consideration. There is a second smaller lesion appearing somewhat similarly at the level of  the left piriform sinus, concerning for a second focus of infection. Again, neoplasm in this area is a differential consideration. Both of these areas warrant direct visualization. There is a single mildly prominent lymph node deep to the sternocleidomastoid muscle suggest posterior to the left jugular vein at the level of C5. No other lymph node prominence seen.  Small retention cysts in the inferior posterior left maxillary antrum. Visualized paranasal sinuses otherwise clear. Mastoids clear bilaterally.  Degenerative change in cervical spine.  Study otherwise unremarkable.   Electronically Signed By: Lowella Grip III M.D. On: 05/22/2015 07:23          INITIAL IMPRESSION / ASSESSMENT AND PLAN / ED COURSE  Pertinent labs & imaging results that were available during my care of the patient were reviewed by me and considered in my medical decision making (see chart for details).  Patient received IV clindamycin 900 mg Patient's care transferred to Dr. Dineen Kid awaiting ENT consultation for peritonsillar  abscess  ____________________________________________   FINAL CLINICAL IMPRESSION(S) / ED DIAGNOSES  Final diagnoses:  Parapharyngeal abscess      Gregor Hams, MD 05/27/15 [phone]

## 2015-05-22 NOTE — ED Notes (Signed)
Pt in with co earache was dx with ear infection yest states pain is worse

## 2015-05-22 NOTE — Transfer of Care (Signed)
Immediate Anesthesia Transfer of Care Note  Patient: Thomas [name]  Procedure(s) Performed: Procedure(s): INCISION AND DRAINAGE OF PERITONSILLAR ABCESS (N/A)  Patient Location: PACU  Anesthesia Type:General  Level of Consciousness: awake and patient cooperative  Airway & Oxygen Therapy: Patient Spontanous Breathing and Patient connected to face mask oxygen  Post-op Assessment: Report given to RN  Post vital signs: Reviewed and stable  Last Vitals:  Filed Vitals:   05/22/15 1152 05/22/15 1703  BP: 132/81 143/89  Pulse: 61 92  Temp: 36.9 C 99.70F  Resp: 14 22    Complications: No apparent anesthesia complications

## 2015-05-22 NOTE — Anesthesia Preprocedure Evaluation (Signed)
Anesthesia Evaluation  Patient identified by MRN, date of birth, ID band Patient awake    Reviewed: Allergy & Precautions, H&P , NPO status , Patient's Chart, lab work & pertinent test results, reviewed documented beta blocker date and time   Airway Mallampati: II  TM Distance: >3 FB Neck ROM: full    Dental  (+) Teeth Intact   Pulmonary neg pulmonary ROS, Current Smoker,    Pulmonary exam normal        Cardiovascular negative cardio ROS Normal cardiovascular exam Rhythm:regular Rate:Normal     Neuro/Psych negative neurological ROS  negative psych ROS   GI/Hepatic negative GI ROS, Neg liver ROS,   Endo/Other  negative endocrine ROS  Renal/GU negative Renal ROS  negative genitourinary   Musculoskeletal   Abdominal   Peds  Hematology negative hematology ROS (+)   Anesthesia Other Findings Past Medical History:   Bipolar 1 disorder ([place])                                   Past Surgical History:   HERNIA REPAIR                                               BMI    Body Mass Index   28.25 kg/m 2     Reproductive/Obstetrics negative OB ROS                             Anesthesia Physical Anesthesia Plan  ASA: II and emergent  Anesthesia Plan: General ETT   Post-op Pain Management:    Induction:   Airway Management Planned:   Additional Equipment:   Intra-op Plan:   Post-operative Plan:   Informed Consent: I have reviewed the patients History and Physical, chart, labs and discussed the procedure including the risks, benefits and alternatives for the proposed anesthesia with the patient or authorized representative who has indicated his/her understanding and acceptance.   Dental Advisory Given  Plan Discussed with: CRNA  Anesthesia Plan Comments:         Anesthesia Quick Evaluation

## 2015-05-22 NOTE — Transfer of Care (Addendum)
Immediate Anesthesia Transfer of Care Note  Patient: Thomas [name]  Procedure(s) Performed: Procedure(s): INCISION AND DRAINAGE OF PERITONSILLAR ABCESS (N/A)  Patient Location: PACU  Anesthesia Type:General  Level of Consciousness: alert  and oriented  Airway & Oxygen Therapy: Patient Spontanous Breathing  Post-op Assessment: Report given to RN and Post -op Vital signs reviewed and stable  Post vital signs: stable  Last Vitals:  Filed Vitals:   05/22/15 1805 05/22/15 1829  BP: 133/83 145/83  Pulse: 66 68  Temp: 36.4 C 37.1 C  Resp: 14 18    Complications: No apparent anesthesia complications

## 2015-05-22 NOTE — Progress Notes (Signed)
Per Dr. Tami Ribas place order for pt top be on clear liquids and advance diet as tolerated.

## 2015-05-22 NOTE — ED Notes (Signed)
Report from Elizabeth, RN

## 2015-05-22 NOTE — ED Notes (Signed)
Admitting MD at bedside.

## 2015-05-22 NOTE — Anesthesia Postprocedure Evaluation (Signed)
Anesthesia Post Note  Patient: Pharmacist, hospital  Procedure(s) Performed: Procedure(s) (LRB): INCISION AND DRAINAGE OF PERITONSILLAR ABCESS (N/A)  Patient location during evaluation: PACU Anesthesia Type: General Level of consciousness: awake and alert Pain management: pain level controlled Vital Signs Assessment: post-procedure vital signs reviewed and stable Respiratory status: spontaneous breathing, nonlabored ventilation, respiratory function stable and patient connected to nasal cannula oxygen Cardiovascular status: blood pressure returned to baseline and stable Postop Assessment: no signs of nausea or vomiting Anesthetic complications: no    Last Vitals:  Filed Vitals:   05/22/15 1805 05/22/15 1829  BP: 133/83 145/83  Pulse: 66 68  Temp: 36.4 C 37.1 C  Resp: 14 18    Last Pain:  Filed Vitals:   05/22/15 1957  PainSc: 0-No pain                 Molli Barrows

## 2015-05-22 NOTE — ED Provider Notes (Signed)
Signout from Dr. Owens Shark in this patient with a peritonsillar abscess. Plan is to admit the patient to the hospital. The patient has already received antibiotics. Physical Exam  BP 130/85 mmHg  Pulse 62  Temp(Src) 98 F (36.7 C) (Oral)  Resp 17  Ht 6' (1.829 m)  Wt 205 lb (92.987 kg)  BMI 27.80 kg/m2  SpO2 93%  ----------------------------------------- 945 AM on 05/22/2015 -----------------------------------------  Discussed case Dr. Ileene Hutchinson nurse who is with him in the operating room. She relayed the message to Dr. Tami Ribas that the patient has a peritonsillar abscess but is not in any respiratory distress at this time. Dr. Tami Ribas is aware of the case and said to admit to medicine and he will see her as a consult.  Physical Exam Patient reexamined at this time and is speaking in full sentences without any respiratory distress or stridor.  Explained to the patient has CAT scan findings as well as plan for admission and likely drainage of his peritonsillar abscess. He understands the plan and is one to comply.  ED Course  Procedures  MDM ----------------------------------------- 10:02 AM on 05/22/2015 -----------------------------------------  Signed out to Dr.Gouru of the hospitalist service.      Orbie Pyo, MD 05/22/15 1002

## 2015-05-22 NOTE — H&P (Signed)
[facility] [place] [facility] [facility] NAME: Thomas [name]    MR#:  [id number]  DATE OF BIRTH:  [birth year]  DATE OF ADMISSION:  05/22/2015  PRIMARY CARE PHYSICIAN: Adrian Prows, MD   REQUESTING/REFERRING PHYSICIAN: Dr Clearnce Hasten  CHIEF COMPLAINT:   Severe throat pain for 2-3 days. HISTORY OF PRESENT ILLNESS:  Thomas [name]  is a 50 y.o. male with a known history of bipolar disorder comes to the emergency room with increasing soreness in his throat for last 3 days. Patient was seen in the emergency room yesterday given by mouth Augmentin and was recommended to follow up with ENT. He continued to have fever or difficulty swallowing and significant throat pain. Workup in the ER showed patient has left peritonsillar abscess. He received IV clindamycin and IV Decadron. ER M.D. notified Dr. Tami Ribas ENT. Patient is being admitted for further eval for management.   PAST MEDICAL HISTORY:   Past Medical History  Diagnosis Date  . Bipolar 1 disorder ([place])     PAST SURGICAL HISTOIRY:   Past Surgical History  Procedure Laterality Date  . Hernia repair      SOCIAL HISTORY:   Social History  Substance Use Topics  . Smoking status: Current Every Day Smoker    Types: Cigarettes  . Smokeless tobacco: Not on file  . Alcohol Use: No    FAMILY HISTORY:  No family history on file.  DRUG ALLERGIES:   Allergies  Allergen Reactions  . Sulfa Antibiotics Hives    REVIEW OF SYSTEMS:  Review of Systems  Constitutional: Positive for fever. Negative for chills and weight loss.  HENT: Positive for ear pain and sore throat. Negative for ear discharge and nosebleeds.   Eyes: Negative for blurred vision, pain and discharge.  Respiratory: Negative for sputum production, shortness of breath, wheezing and stridor.   Cardiovascular: Negative for chest pain, palpitations, orthopnea and PND.  Gastrointestinal: Negative for nausea, vomiting, abdominal pain and  diarrhea.  Genitourinary: Negative for urgency and frequency.  Musculoskeletal: Negative for back pain and joint pain.  Neurological: Negative for sensory change, speech change, focal weakness and weakness.  Psychiatric/Behavioral: Negative for depression and hallucinations. The patient is not nervous/anxious.   All other systems reviewed and are negative.    MEDICATIONS AT HOME:   Prior to Admission medications   Medication Sig Start Date End Date Taking? Authorizing Provider  amoxicillin-clavulanate (AUGMENTIN) 875-125 MG tablet Take 1 tablet by mouth 2 (two) times daily. 05/21/15 05/30/15 Yes Gregor Hams, MD  ibuprofen (ADVIL,MOTRIN) 200 MG tablet Take 800 mg by mouth every 6 (six) hours as needed.   Yes Historical Provider, MD  venlafaxine XR (EFFEXOR-XR) 75 MG 24 hr capsule Take 1 capsule by mouth 2 (two) times daily. 08/20/14  Yes Historical Provider, MD      VITAL SIGNS:  Blood pressure 137/88, pulse 59, temperature 98 F (36.7 C), temperature source Oral, resp. rate 18, height 6' (1.829 m), weight 92.987 kg (205 lb), SpO2 92 %.  PHYSICAL EXAMINATION:  GENERAL:  50 y.o.-year-old patient lying in the bed with no acute distress.  EYES: Pupils equal, round, reactive to light and accommodation. No scleral icterus. Extraocular muscles intact.  HEENT: Head atraumatic, normocephalic. Left peritonisllar swelling + no pus seen. NECK:  Supple, no jugular venous distention. No thyroid enlargement,  Tenderness + in the neck latterally LUNGS: Normal breath sounds bilaterally, no wheezing, rales,rhonchi or crepitation. No use of accessory muscles of respiration.  CARDIOVASCULAR: S1, S2  normal. No murmurs, rubs, or gallops.  ABDOMEN: Soft, nontender, nondistended. Bowel sounds present. No organomegaly or mass.  EXTREMITIES: No pedal edema, cyanosis, or clubbing.  NEUROLOGIC: Cranial nerves II through XII are intact. Muscle strength 5/5 in all extremities. Sensation intact. Gait not checked.   PSYCHIATRIC: The patient is alert and oriented x 3.  SKIN: No obvious rash, lesion, or ulcer.   LABORATORY PANEL:   CBC  Recent Labs Lab 05/22/15 0526  WBC 18.9*  HGB 15.4  HCT 46.4  PLT 208   ------------------------------------------------------------------------------------------------------------------  Chemistries   Recent Labs Lab 05/22/15 0526  NA 135  K 3.2*  CL 102  CO2 25  GLUCOSE 124*  BUN 15  CREATININE 1.01  CALCIUM 9.1  AST 12*  ALT 14*  ALKPHOS 79  BILITOT 0.5   ------------------------------------------------------------------------------------------------------------------  Cardiac Enzymes No results for input(s): TROPONINI in the last 168 hours. ------------------------------------------------------------------------------------------------------------------  RADIOLOGY:  Ct Soft Tissue Neck W Contrast  05/22/2015  CLINICAL DATA:  Left tonsillar swelling and vocal difficulty EXAM: CT NECK WITH CONTRAST TECHNIQUE: Multidetector CT imaging of the neck was performed using the standard protocol following the bolus administration of intravenous contrast. CONTRAST:  85mL OMNIPAQUE IOHEXOL 300 MG/ML  SOLN COMPARISON:  None. FINDINGS: Pharynx and larynx: There is a lesion arising in the left peritonsillar area measuring 4.5 x 3.0 x 2.5 cm which is causing impression on the upper pharyngeal airway with slight deviation of the upper pharyngeal airway to the right. The borders of this area are ill-defined with a relative decreased attenuation central area and a thick peripheral area of attenuation and similar to the surrounding soft tissues. In addition, there is effacement of the left piriform sinus with a similar appearing area of decreased central region with a thinner area of slightly higher attenuation measuring 1.4 x 1.1 x 1.0 cm. No laryngeal mass or inflammation is seen. The epiglottis and aryepiglottic folds appear normal. Tongue and tongue base regions  appear normal. The prevertebral soft tissues are normal. There remainder of the pharyngeal and visualized tracheal air column appear normal. Salivary glands: Salivary glands appear symmetric and normal bilaterally. Thyroid: There is a 4 x 4 mm focus of decreased attenuation in the left lobe of the thyroid. Thyroid otherwise appears normal. Lymph nodes: There is a 1.3 x 1.1 cm lymph node just posterior to the left jugular vein and deep to the sternocleidomastoid muscle at the level of C5. No other lymph node prominence is appreciable on this study. Orbits: Only the inferior orbits are visualized. The visualized intraorbital regions appear symmetric and normal bilaterally. Mastoids and visualized paranasal sinuses: There is a small retention cyst in the inferior posterior left maxillary antrum. Visualized paranasal sinuses otherwise appear clear. Mastoids are clear bilaterally. Skeleton: There is degenerative change in the cervical spine. There are no blastic or lytic bone lesions. There is mild bullous disease in the apices bilaterally. There is no mass or infiltrate in the visualized upper lung zone regions. There is no adenopathy in the visualized upper thoracic region. IMPRESSION: There is a focal lesion arising from the left tonsil causing mild rightward deviation of the upper pharyngeal air column. This area has a lucent central area with a thick peripheral region measuring 4.5 x 3.0 x 2.5 cm. Suspect peritonsillar abscess, although neoplasm is a differential consideration. There is a second smaller lesion appearing somewhat similarly at the level of the left piriform sinus, concerning for a second focus of infection. Again, neoplasm in this area is a  differential consideration. Both of these areas warrant direct visualization. There is a single mildly prominent lymph node deep to the sternocleidomastoid muscle suggest posterior to the left jugular vein at the level of C5. No other lymph node prominence seen.  Small retention cysts in the inferior posterior left maxillary antrum. Visualized paranasal sinuses otherwise clear. Mastoids clear bilaterally. Degenerative change in cervical spine. Study otherwise unremarkable. Electronically Signed   By: Lowella Grip III M.D.   On: 05/22/2015 07:23   IMPRESSION AND PLAN:   Thomas [name]  is a 50 y.o. male with a known history of bipolar disorder comes to the emergency room with increasing soreness in his throat for last 3 days.  1. Acute left peritonsillar abscess. Patient failed outpatient treatment with by mouth Augmentin. Patient did have significant throat pain or difficulty swallowing and fever -Admit to medical floor -IV clindamycin 600 mg 3 times a day, IV Decadron 10 mg every 6 hourly -Spoke with Dr. Tami Ribas ENT. Will keep patient nothing by mouth patient may require I&D of his abscess. -By mouth and IV pain medication  2. Elevated blood pressure without diagnosis of hypertension and suspected due to pain from #1 When necessary hydralazine -Patient's blood pressure remains persistently elevated consider starting antihypertensives  3. Leukocytosis due to 1  4. Bipolar disorder Continue effexor  5. DVT prophylaxis subcutaneous Lovenox  6. Tobacco abuse patient smokes about pack a pack and a half a day. Smoking cessation advised. 4 minutes spent.  Discussed with patient and patient's family in the ER.  All the records are reviewed and case discussed with ED provider. Management plans discussed with the patient, family and they are in agreement.  CODE STATUS: Full  TOTAL TIME TAKING CARE OF THIS PATIENT: 45 minutes.    Thomas [name] M.D on 05/22/2015 at 11:44 AM  Between 7am to 6pm - Pager - [phone]  After 6pm go to [email or web address] - password [email or web address] [facility]  [facility]  Office  [phone]  CC: Primary care physician; Adrian Prows, MD

## 2015-05-22 NOTE — Consult Note (Signed)
Thomas [name], Thomas [name] [id number] 09/14/1965 Fritzi Mandes, MD  Reason for Consult: Peritonsillar abscess  HPI: 50 year old gentleman 5-6 day history of severe sore throat seen emergency room and treated orally but did not improve return today with increased difficulty swallowing. CT scan showed a left peritonsillar abscess.  Allergies:  Allergies  Allergen Reactions  . Sulfa Antibiotics Hives    ROS: Review of systems normal other than 12 systems except per HPI.  PMH:  Past Medical History  Diagnosis Date  . Bipolar 1 disorder ([facility])     FH:  Family History  Problem Relation Age of Onset  . Aneurysm Mother   . Heart disease Father     SH:  Social History   Social History  . Marital Status: Divorced    Spouse Name: N/A  . Number of Children: N/A  . Years of Education: N/A   Occupational History  . Not on file.   Social History Main Topics  . Smoking status: Current Every Day Smoker -- 2.00 packs/day    Types: Cigarettes  . Smokeless tobacco: Not on file  . Alcohol Use: No  . Drug Use: No  . Sexual Activity: Not on file   Other Topics Concern  . Not on file   Social History Narrative    PSH:  Past Surgical History  Procedure Laterality Date  . Hernia repair      Physical  Exam: CN 2-12 grossly intact and symmetric. EAC/TMs normal BL. Obvious left peritonsillar abscess significant edema and swelling. Skin warm and dry. Nasal cavity without polyps or purulence. External nose and ears without masses or lesions. EOMI, PERRLA. Neck supple with no masses or lesions. No lymphadenopathy palpated. Thyroid normal with no masses. Heart regular rate and rhythm lungs clear to auscultation  A/P: Peritonsillar abscess will take to the operating room for incision and drainage he understands risk and benefits including bleeding infection and is eager to proceed   Thomas [name] T 05/22/2015 4:04 PM

## 2015-05-22 NOTE — Anesthesia Procedure Notes (Signed)
Procedure Name: Intubation Date/Time: 05/22/2015 4:27 PM Performed by: Dionne Bucy Pre-anesthesia Checklist: Patient identified, Patient being monitored, Timeout performed, Emergency Drugs available and Suction available Patient Re-evaluated:Patient Re-evaluated prior to inductionOxygen Delivery Method: Circle system utilized Preoxygenation: Pre-oxygenation with 100% oxygen Intubation Type: IV induction Ventilation: Mask ventilation without difficulty Laryngoscope Size: Glidescope and 4 Grade View: Grade I Tube type: Oral Rae Tube size: 7.0 mm Number of attempts: 2 (see below) Airway Equipment and Method: Stylet Placement Confirmation: ETT inserted through vocal cords under direct vision,  positive ETCO2 and breath sounds checked- equal and bilateral Secured at: 21 cm Tube secured with: Tape Dental Injury: Teeth and Oropharynx as per pre-operative assessment  Comments: DL x1 with Mac 4; Grade 3 view; changed to Glidescope # 4 with grade 1 view: +etco2 and B breath sounds,equal

## 2015-05-22 NOTE — Progress Notes (Signed)
Labetalol 5mg   given  For blood pressure 145/97

## 2015-05-22 NOTE — Op Note (Signed)
05/22/2015 4:46 PM    [name], Thomas [place]  [id number]   Pre-Op Dx:  left  Peritonsillar Abscess  Post-op Dx: same  Proc:  I&D Left PTA  Surg:  Beverly Gust T  Anes:  GOT  EBL:  20cc  Comp:  none  Findings:  6 cc pus  Procedure: With the patient in a comfortable supine position, GOT was administered in standard fashion.    At an appropriate level,  the table was turned 90 degrees away from anesthesia  and placed in Trendelenberg position. Routine clean preparation and draping was performed. Taking care to protect lips, teeth and endotracheal tube, the Crowe-Davis mouth gag was introduced, expanded for visualization, and suspended from the [place] stand in the standard fashion.  The findings were as described as above.  An 18-gauge needle was used to probe the abscess cavity and the pus was encountered. A 15 blade was then used to make an incision soft tissue overlying this tract a hemostat was then placed in this and opened widely by some 6 cc of pus was expressed this was sent for culture A frank abscess cavity was encountered, and widely opened.  A culture was obtained.   Hemostasis was spontaneous.  The cavity was irrigated with sterile saline and clindamycin 600 mg solution.  The mouth gag was relaxed for several minutes.  Upon re-expansion, hemostasis was observed.  At this point the procedure was completed.  The mouth gag was relaxed and removed.  The dental status was  Intact.  The patient was returned to Anesthesia, awakened, extubated, and transferred to PACU in stable condition.    Dispo:   PACU to Home  Plan:  Hydration, antibiosis, analgesia.   Given low anticipated risk of post-anesthetic or post-surgical complications, feel an outpatient venue is appropriate.  Coraleigh Sheeran T 05/22/2015 4:46 PM

## 2015-05-22 NOTE — Progress Notes (Signed)
Per Dr. Posey Pronto do not give oral meds as well as hold lovenox, pt may be going to surgery this afternoon.

## 2015-05-23 LAB — CBC
HEMATOCRIT: 41.7 % (ref 40.0–52.0)
Hemoglobin: 13.7 g/dL (ref 13.0–18.0)
MCH: 26.5 pg (ref 26.0–34.0)
MCHC: 32.9 g/dL (ref 32.0–36.0)
MCV: 80.7 fL (ref 80.0–100.0)
PLATELETS: 208 10*3/uL (ref 150–440)
RBC: 5.16 MIL/uL (ref 4.40–5.90)
RDW: 14.4 % (ref 11.5–14.5)
WBC: 15.8 10*3/uL — ABNORMAL HIGH (ref 3.8–10.6)

## 2015-05-23 MED ORDER — OXYCODONE HCL 5 MG PO TABS: 5.0000 mg | ORAL_TABLET | ORAL | Status: DC | PRN

## 2015-05-23 MED ORDER — PREDNISONE 10 MG (21) PO TBPK: 10.0000 mg | ORAL_TABLET | Freq: Four times a day (QID) | ORAL | Status: DC

## 2015-05-23 MED ORDER — PREDNISONE 10 MG (21) PO TBPK: 10.0000 mg | ORAL_TABLET | ORAL | Status: DC

## 2015-05-23 MED ORDER — PREDNISONE 10 MG (21) PO TBPK: 20.0000 mg | ORAL_TABLET | Freq: Every evening | ORAL | Status: DC

## 2015-05-23 MED ORDER — PREDNISONE 10 MG (21) PO TBPK: 10.0000 mg | ORAL_TABLET | Freq: Three times a day (TID) | ORAL | Status: DC

## 2015-05-23 MED ORDER — PREDNISONE 10 MG (21) PO TBPK
20.0000 mg | ORAL_TABLET | Freq: Every morning | ORAL | Status: DC
  Filled 2015-05-23: qty 21

## 2015-05-23 NOTE — Discharge Summary (Signed)
[facility] NAME: Thomas [name]    MR#:  [id number]  DATE OF BIRTH:  09-01-1965  DATE OF ADMISSION:  05/22/2015 ADMITTING PHYSICIAN: Fritzi Mandes, MD  DATE OF DISCHARGE: 05/23/15  PRIMARY CARE PHYSICIAN: Adrian Prows, MD    ADMISSION DIAGNOSIS:  Parapharyngeal abscess [J39.0]  DISCHARGE DIAGNOSIS:  Left peritonsillar abscess s/p I and D  SECONDARY DIAGNOSIS:   Past Medical History  Diagnosis Date  . Bipolar 1 disorder [facility])     HOSPITAL COURSE:  Thomas [name] is a 50 y.o. male with a known history of bipolar disorder comes to the emergency room with increasing soreness in his throat for last 3 days.  1. Acute left peritonsillar abscess. Patient failed outpatient treatment with by mouth Augmentin. Patient did have significant throat pain or difficulty swallowing and fever -IV clindamycin 600 mg 3 times a day, IV Decadron 10 mg every 6 hourly--->change to po augmentin (pt already has it) and Sterapred DS 21 tab pack -s/i I&D of his abscess by dr Tami Ribas -ok to d/c per ENT  2. Elevated blood pressure without diagnosis of hypertension and suspected due to pain from #1 When necessary hydralazine -Patient's blood pressure remains persistently elevated consider starting antihypertensives -bp much improved  3. Leukocytosis due to 1  4. Bipolar disorder Continue effexor  5. DVT prophylaxis subcutaneous Lovenox  6. Tobacco abuse patient smokes about pack a pack and a half a day. Smoking cessation advised. 4 minutes spent. Overall stable to go home. Pt agreeable CONSULTS OBTAINED:  Treatment Team:  Beverly Gust, MD  DRUG ALLERGIES:   Allergies  Allergen Reactions  . Sulfa Antibiotics Hives    DISCHARGE MEDICATIONS:   Current Discharge Medication List    START taking these medications   Details  oxyCODONE (OXY IR/ROXICODONE) 5 MG immediate release tablet Take 1 tablet (5 mg total) by mouth every 4  (four) hours as needed for moderate pain. Qty: 30 tablet, Refills: 0    predniSONE (STERAPRED UNI-PAK 21 TAB) 10 MG (21) TBPK tablet Take 1 tablet (10 mg total) by mouth PC lunch. Take as per instruction Qty: 21 tablet, Refills: 0      CONTINUE these medications which have NOT CHANGED   Details  amoxicillin-clavulanate (AUGMENTIN) 875-125 MG tablet Take 1 tablet by mouth 2 (two) times daily. Qty: 20 tablet, Refills: 0    ibuprofen (ADVIL,MOTRIN) 200 MG tablet Take 800 mg by mouth every 6 (six) hours as needed.    venlafaxine XR (EFFEXOR-XR) 75 MG 24 hr capsule Take 1 capsule by mouth 2 (two) times daily.        If you experience worsening of your admission symptoms, develop shortness of breath, life threatening emergency, suicidal or homicidal thoughts you must seek medical attention immediately by calling 911 or calling your MD immediately  if symptoms less severe.  You Must read complete instructions/literature along with all the possible adverse reactions/side effects for all the Medicines you take and that have been prescribed to you. Take any new Medicines after you have completely understood and accept all the possible adverse reactions/side effects.   Please note  You were cared for by a hospitalist during your hospital stay. If you have any questions about your discharge medications or the care you received while you were in the hospital after you are discharged, you can call the unit and asked to speak with the hospitalist on call if the hospitalist that took care of you is  not available. Once you are discharged, your primary care physician will handle any further medical issues. Please note that NO REFILLS for any discharge medications will be authorized once you are discharged, as it is imperative that you return to your primary care physician (or establish a relationship with a primary care physician if you do not have one) for your aftercare needs so that they can reassess your  need for medications and monitor your lab values. Today   SUBJECTIVE   Doing well  VITAL SIGNS:  Blood pressure 112/76, pulse 67, temperature 97.9 F (36.6 C), temperature source Oral, resp. rate 16, height 6' (1.829 m), weight 94.53 kg (208 lb 6.4 oz), SpO2 98 %.  I/O:   Intake/Output Summary (Last 24 hours) at 05/23/15 1040 Last data filed at 05/23/15 0948  Gross per 24 hour  Intake 2356.67 ml  Output   1325 ml  Net 1031.67 ml    PHYSICAL EXAMINATION:  GENERAL:  50 y.o.-year-old patient lying in the bed with no acute distress.  EYES: Pupils equal, round, reactive to light and accommodation. No scleral icterus. Extraocular muscles intact.  HEENT: Head atraumatic, normocephalic. Oropharynx and nasopharynx clear. Poor oral hygiene NECK:  Supple, no jugular venous distention. No thyroid enlargement, no tenderness.  LUNGS: Normal breath sounds bilaterally, no wheezing, rales,rhonchi or crepitation. No use of accessory muscles of respiration.  CARDIOVASCULAR: S1, S2 normal. No murmurs, rubs, or gallops.  ABDOMEN: Soft, non-tender, non-distended. Bowel sounds present. No organomegaly or mass.  EXTREMITIES: No pedal edema, cyanosis, or clubbing.  NEUROLOGIC: Cranial nerves II through XII are intact. Muscle strength 5/5 in all extremities. Sensation intact. Gait not checked.  PSYCHIATRIC: The patient is alert and oriented x 3.  SKIN: No obvious rash, lesion, or ulcer.   DATA REVIEW:   CBC   Recent Labs Lab 05/23/15 0643  WBC 15.8*  HGB 13.7  HCT 41.7  PLT 208    Chemistries   Recent Labs Lab 05/22/15 0526  NA 135  K 3.2*  CL 102  CO2 25  GLUCOSE 124*  BUN 15  CREATININE 1.01  CALCIUM 9.1  AST 12*  ALT 14*  ALKPHOS 79  BILITOT 0.5    Microbiology Results   No results found for this or any previous visit (from the past 240 hour(s)).  RADIOLOGY:  Ct Soft Tissue Neck W Contrast  05/22/2015  CLINICAL DATA:  Left tonsillar swelling and vocal difficulty EXAM:  CT NECK WITH CONTRAST TECHNIQUE: Multidetector CT imaging of the neck was performed using the standard protocol following the bolus administration of intravenous contrast. CONTRAST:  60mL OMNIPAQUE IOHEXOL 300 MG/ML  SOLN COMPARISON:  None. FINDINGS: Pharynx and larynx: There is a lesion arising in the left peritonsillar area measuring 4.5 x 3.0 x 2.5 cm which is causing impression on the upper pharyngeal airway with slight deviation of the upper pharyngeal airway to the right. The borders of this area are ill-defined with a relative decreased attenuation central area and a thick peripheral area of attenuation and similar to the surrounding soft tissues. In addition, there is effacement of the left piriform sinus with a similar appearing area of decreased central region with a thinner area of slightly higher attenuation measuring 1.4 x 1.1 x 1.0 cm. No laryngeal mass or inflammation is seen. The epiglottis and aryepiglottic folds appear normal. Tongue and tongue base regions appear normal. The prevertebral soft tissues are normal. There remainder of the pharyngeal and visualized tracheal air column appear normal. Salivary glands: Salivary  glands appear symmetric and normal bilaterally. Thyroid: There is a 4 x 4 mm focus of decreased attenuation in the left lobe of the thyroid. Thyroid otherwise appears normal. Lymph nodes: There is a 1.3 x 1.1 cm lymph node just posterior to the left jugular vein and deep to the sternocleidomastoid muscle at the level of C5. No other lymph node prominence is appreciable on this study. Orbits: Only the inferior orbits are visualized. The visualized intraorbital regions appear symmetric and normal bilaterally. Mastoids and visualized paranasal sinuses: There is a small retention cyst in the inferior posterior left maxillary antrum. Visualized paranasal sinuses otherwise appear clear. Mastoids are clear bilaterally. Skeleton: There is degenerative change in the cervical spine. There  are no blastic or lytic bone lesions. There is mild bullous disease in the apices bilaterally. There is no mass or infiltrate in the visualized upper lung zone regions. There is no adenopathy in the visualized upper thoracic region. IMPRESSION: There is a focal lesion arising from the left tonsil causing mild rightward deviation of the upper pharyngeal air column. This area has a lucent central area with a thick peripheral region measuring 4.5 x 3.0 x 2.5 cm. Suspect peritonsillar abscess, although neoplasm is a differential consideration. There is a second smaller lesion appearing somewhat similarly at the level of the left piriform sinus, concerning for a second focus of infection. Again, neoplasm in this area is a differential consideration. Both of these areas warrant direct visualization. There is a single mildly prominent lymph node deep to the sternocleidomastoid muscle suggest posterior to the left jugular vein at the level of C5. No other lymph node prominence seen. Small retention cysts in the inferior posterior left maxillary antrum. Visualized paranasal sinuses otherwise clear. Mastoids clear bilaterally. Degenerative change in cervical spine. Study otherwise unremarkable. Electronically Signed   By: Lowella Grip III M.D.   On: 05/22/2015 07:23     Management plans discussed with the patient, family and they are in agreement.  CODE STATUS:     Code Status Orders        Start     Ordered   05/22/15 1652  Full code   Continuous     05/22/15 1658    Code Status History    Date Active Date Inactive Code Status Order ID Comments User Context   05/22/2015 11:47 AM 05/22/2015  4:58 PM Full Code [id number]  Fritzi Mandes, MD Inpatient      TOTAL TIME TAKING CARE OF THIS PATIENT: 25minutes.    Thomas [name] M.D on 05/23/2015 at 10:40 AM  Between 7am to 6pm - Pager - [phone] After 6pm go to [email or web address] - password [email or web address] [facility]  [place] [facility]  Office   [phone]  CC: Primary care physician; Adrian Prows, MD

## 2015-05-23 NOTE — Progress Notes (Signed)
MD ordered patient to be discharged home.  Discharge instructions were reviewed with the patient and he voiced understanding.  Prescription was given to the patient.  Patient instructed to call on Monday to make his follow-up appointments.  IV was removed with catheter intact.  All patient's questions were answered.  Patient leaving via wheelchair escorted by auxiliary.

## 2015-05-23 NOTE — Discharge Instructions (Signed)
Soft diet/easy to swallow  Notify MD for any swelling of the throat, difficulty swallowing, bleeding, fever of 100.5 or higher or pain that is not relieved with medications.  Be sure to take the full regimen of antibiotics.  Do not stop the prednisone abruptly be sure to taper as instructed.

## 2015-05-23 NOTE — Progress Notes (Signed)
05/23/2015 8:54 AM  Riedl, Jerrye Beavers [id number]  Post-Op Day 1    Temp:  [97.5 F (36.4 C)-99.9 F (37.7 C)] 97.9 F (36.6 C) (01/21 0550) Pulse Rate:  [43-93] 67 (01/21 0550) Resp:  [13-23] 16 (01/21 0550) BP: (92-149)/(51-101) 112/76 mmHg (01/21 0550) SpO2:  [91 %-98 %] 98 % (01/21 0550) Weight:  [94.53 kg (208 lb 6.4 oz)] 94.53 kg (208 lb 6.4 oz) (01/20 1412),     Intake/Output Summary (Last 24 hours) at 05/23/15 0854 Last data filed at 05/23/15 0733  Gross per 24 hour  Intake 2356.67 ml  Output    775 ml  Net 1581.67 ml    Results for orders placed or performed during the hospital encounter of 05/22/15 (from the past 24 hour(s))  CBC     Status: Abnormal   Collection Time: 05/23/15  6:43 AM  Result Value Ref Range   WBC 15.8 (H) 3.8 - 10.6 K/uL   RBC 5.16 4.40 - 5.90 MIL/uL   Hemoglobin 13.7 13.0 - 18.0 g/dL   HCT 41.7 40.0 - 52.0 %   MCV 80.7 80.0 - 100.0 fL   MCH 26.5 26.0 - 34.0 pg   MCHC 32.9 32.0 - 36.0 g/dL   RDW 14.4 11.5 - 14.5 %   Platelets 208 150 - 440 K/uL    SUBJECTIVE:  Feeling much better, taking po without difficulty  OBJECTIVE:  Pharynx much improved swelling nearly resolved  IMPRESSION:  Peritonsiller abscess resolved  PLAN:  DC to home, he has augmentin at home which he will continue.  Dr. Posey Pronto to DC and send home with 6 day double strength steripred taper.  He will followup with me in 1 week.    Rosalina Dingwall T 05/23/2015, 8:54 AM

## 2015-05-25 ENCOUNTER — Encounter: Payer: Self-pay | Admitting: Unknown Physician Specialty

## 2015-05-25 LAB — CULTURE, ROUTINE-ABSCESS: Culture: NORMAL

## 2015-05-26 LAB — ANAEROBIC CULTURE

## 2016-01-04 ENCOUNTER — Emergency Department
Admission: EM | Admit: 2016-01-04 | Discharge: 2016-01-04 | Disposition: A | Discharge status: Home / Self Care | Payer: Self-pay | Attending: Emergency Medicine | Admitting: Emergency Medicine

## 2016-01-04 ENCOUNTER — Encounter: Payer: Self-pay | Admitting: Emergency Medicine

## 2016-01-04 ENCOUNTER — Emergency Department: Payer: Self-pay

## 2016-01-04 DIAGNOSIS — Z791 Long term (current) use of non-steroidal anti-inflammatories (NSAID): Secondary | ICD-10-CM | POA: Insufficient documentation

## 2016-01-04 DIAGNOSIS — M62830 Muscle spasm of back: Secondary | ICD-10-CM

## 2016-01-04 DIAGNOSIS — F17210 Nicotine dependence, cigarettes, uncomplicated: Secondary | ICD-10-CM | POA: Insufficient documentation

## 2016-01-04 DIAGNOSIS — R109 Unspecified abdominal pain: Secondary | ICD-10-CM | POA: Insufficient documentation

## 2016-01-04 DIAGNOSIS — Z79899 Other long term (current) drug therapy: Secondary | ICD-10-CM | POA: Insufficient documentation

## 2016-01-04 LAB — COMPREHENSIVE METABOLIC PANEL
ALBUMIN: 4.4 g/dL (ref 3.5–5.0)
ALT: 26 U/L (ref 17–63)
AST: 27 U/L (ref 15–41)
Alkaline Phosphatase: 80 U/L (ref 38–126)
Anion gap: 7 (ref 5–15)
BUN: 15 mg/dL (ref 6–20)
CHLORIDE: 103 mmol/L (ref 101–111)
CO2: 29 mmol/L (ref 22–32)
CREATININE: 1.24 mg/dL (ref 0.61–1.24)
Calcium: 9.3 mg/dL (ref 8.9–10.3)
GFR calc Af Amer: >60 mL/min (ref >60)
GLUCOSE: 95 mg/dL (ref 65–99)
Potassium: 3.3 mmol/L — ABNORMAL LOW (ref 3.5–5.1)
Sodium: 139 mmol/L (ref 135–145)
Total Bilirubin: 0.8 mg/dL (ref 0.3–1.2)
Total Protein: 7.8 g/dL (ref 6.5–8.1)

## 2016-01-04 LAB — URINALYSIS COMPLETE WITH MICROSCOPIC (ARMC ONLY)
BILIRUBIN URINE: NEGATIVE
Glucose, UA: NEGATIVE mg/dL
Hgb urine dipstick: NEGATIVE
KETONES UR: NEGATIVE mg/dL
Leukocytes, UA: NEGATIVE
Nitrite: NEGATIVE
PROTEIN: 100 mg/dL — AB
Specific Gravity, Urine: 1.023 (ref 1.005–1.030)
pH: 5 (ref 5.0–8.0)

## 2016-01-04 LAB — CBC
HEMATOCRIT: 45 % (ref 40.0–52.0)
Hemoglobin: 15.7 g/dL (ref 13.0–18.0)
MCH: 28.3 pg (ref 26.0–34.0)
MCHC: 34.8 g/dL (ref 32.0–36.0)
MCV: 81.2 fL (ref 80.0–100.0)
PLATELETS: 202 10*3/uL (ref 150–440)
RBC: 5.55 MIL/uL (ref 4.40–5.90)
RDW: 14.1 % (ref 11.5–14.5)
WBC: 13.5 10*3/uL — AB (ref 3.8–10.6)

## 2016-01-04 MED ORDER — MORPHINE SULFATE (PF) 4 MG/ML IV SOLN
4.0000 mg | Freq: Once | INTRAVENOUS | Status: AC
  Administered 2016-01-04: 4 mg via INTRAVENOUS
  Filled 2016-01-04: qty 1

## 2016-01-04 MED ORDER — MELOXICAM 15 MG PO TABS: 15.0000 mg | ORAL_TABLET | Freq: Every day | ORAL | 0 refills | Status: DC

## 2016-01-04 MED ORDER — KETOROLAC TROMETHAMINE 30 MG/ML IJ SOLN
30.0000 mg | Freq: Once | INTRAMUSCULAR | Status: AC
  Administered 2016-01-04: 30 mg via INTRAVENOUS
  Filled 2016-01-04: qty 1

## 2016-01-04 MED ORDER — TRAMADOL HCL 50 MG PO TABS
50.0000 mg | ORAL_TABLET | Freq: Four times a day (QID) | ORAL | 0 refills | Status: DC | PRN
Start: 2016-01-04 — End: 2018-05-28

## 2016-01-04 MED ORDER — ONDANSETRON HCL 4 MG/2ML IJ SOLN
4.0000 mg | Freq: Once | INTRAMUSCULAR | Status: AC
  Administered 2016-01-04: 4 mg via INTRAVENOUS
  Filled 2016-01-04: qty 2

## 2016-01-04 MED ORDER — METHOCARBAMOL 500 MG PO TABS: 500.0000 mg | ORAL_TABLET | Freq: Four times a day (QID) | ORAL | 0 refills | Status: DC

## 2016-01-04 MED ORDER — DIAZEPAM 5 MG/ML IJ SOLN
5.0000 mg | Freq: Once | INTRAMUSCULAR | Status: AC
  Administered 2016-01-04: 5 mg via INTRAVENOUS
  Filled 2016-01-04: qty 2

## 2016-01-04 MED ORDER — SODIUM CHLORIDE 0.9 % IV BOLUS (SEPSIS)
1000.0000 mL | Freq: Once | INTRAVENOUS | Status: AC
  Administered 2016-01-04: 1000 mL via INTRAVENOUS

## 2016-01-04 NOTE — ED Triage Notes (Signed)
Reports right side back pain.  Worse with movement, denies urinary complaints.

## 2016-01-04 NOTE — ED Notes (Signed)
Pt presents with lower back pain since Friday increasing in intensity today. Pt states he took 2 bayer back and body pills at lunch. States he has never had anything like this before. Has also had sweating, n/v since lunch time. Pt moaning and groaning upon assessment.

## 2016-01-04 NOTE — ED Notes (Signed)
Pt discharged home after verbalizing understanding of discharge instructions; nad noted. 

## 2016-01-04 NOTE — ED Notes (Signed)
Pt became very sleepy and began mumbling upon administration of valium. This nurse stayed with patient until he began to be more alert. Pt states he has had valium before with no adverse effects.

## 2016-01-04 NOTE — ED Notes (Signed)
Pt awake and alert; pain 2/10. NAD Noted.

## 2016-01-04 NOTE — ED Provider Notes (Signed)
[facility] Emergency Department Provider Note  ____________________________________________  Time seen: Approximately 4:32 PM  I have reviewed the triage vital signs and the nursing notes.   HISTORY  Chief Complaint Back Pain    HPI Thomas [name] is a 50 y.o. male who presents to emergency department complaining of severe right lower back/flank pain. Patient states that symptoms began 2 days ago but has increased in severity. Patient states that the pain is sharp, severe, constant. Patient denies any history of kidney stones. He denies any distinct hematuria but does state that his urine is "dark." Patient has been taking over-the-counter medications with no relief. Patient denies any injury to area. He denies any abdominal pain, rectal bleeding. No other complaints at this time.   Past Medical History:  Diagnosis Date  . Bipolar 1 disorder [facility])     Patient Active Problem List   Diagnosis Date Noted  . Peritonsillar abscess 05/22/2015  . Tonsil, abscess 05/22/2015    Past Surgical History:  Procedure Laterality Date  . HERNIA REPAIR    . INCISION AND DRAINAGE OF PERITONSILLAR ABCESS N/A 05/22/2015   Procedure: INCISION AND DRAINAGE OF PERITONSILLAR ABCESS;  Surgeon: Beverly Gust, MD;  Location: [facility] ORS;  Service: ENT;  Laterality: N/A;    Prior to Admission medications   Medication Sig Start Date End Date Taking? Authorizing Provider  ibuprofen (ADVIL,MOTRIN) 200 MG tablet Take 800 mg by mouth every 6 (six) hours as needed.    Historical Provider, MD  meloxicam (MOBIC) 15 MG tablet Take 1 tablet (15 mg total) by mouth daily. 01/04/16   Charline Bills Cuthriell, PA-C  methocarbamol (ROBAXIN) 500 MG tablet Take 1 tablet (500 mg total) by mouth 4 (four) times daily. 01/04/16   Charline Bills Cuthriell, PA-C  oxyCODONE (OXY IR/ROXICODONE) 5 MG immediate release tablet Take 1 tablet (5 mg total) by mouth every 4 (four) hours as needed for moderate pain. 05/23/15    Fritzi Mandes, MD  predniSONE (STERAPRED UNI-PAK 21 TAB) 10 MG (21) TBPK tablet Take 1 tablet (10 mg total) by mouth PC lunch. Take as per instruction 05/23/15   Fritzi Mandes, MD  traMADol (ULTRAM) 50 MG tablet Take 1 tablet (50 mg total) by mouth every 6 (six) hours as needed. 01/04/16   Charline Bills Cuthriell, PA-C  venlafaxine XR (EFFEXOR-XR) 75 MG 24 hr capsule Take 1 capsule by mouth 2 (two) times daily. 08/20/14   Historical Provider, MD    Allergies Sulfa antibiotics  Family History  Problem Relation Age of Onset  . Aneurysm Mother   . Heart disease Father     Social History Social History  Substance Use Topics  . Smoking status: Current Every Day Smoker    Packs/day: 2.00    Types: Cigarettes  . Smokeless tobacco: Never Used  . Alcohol use No     Review of Systems  Constitutional: No fever/chills Eyes: No visual changes. No discharge ENT: No upper respiratory complaints. Cardiovascular: no chest pain. Respiratory: no cough. No SOB. Gastrointestinal: No abdominal pain.  No nausea, no vomiting.  No diarrhea.  No constipation. Genitourinary: Negative for dysuria. No hematuria. He reports urine is "dark." Musculoskeletal: Positive for right lower back/flank pain. Skin: Negative for rash, abrasions, lacerations, ecchymosis. Neurological: Negative for headaches, focal weakness or numbness. 10-point ROS otherwise negative.  ____________________________________________   PHYSICAL EXAM:  VITAL SIGNS: ED Triage Vitals  Enc Vitals Group     BP 01/04/16 1601 (!) 141/93     Pulse Rate 01/04/16  1601 61     Resp 01/04/16 1601 18     Temp 01/04/16 1601 97.6 F (36.4 C)     Temp Source 01/04/16 1601 Oral     SpO2 01/04/16 1601 96 %     Weight 01/04/16 1558 200 lb (90.7 kg)     Height 01/04/16 1558 5\' 10"  (1.778 m)     Head Circumference --      Peak Flow --      Pain Score 01/04/16 1559 9     Pain Loc --      Pain Edu? --      Excl. in [place]? --      Constitutional: Alert  and oriented. Well appearing and in no acute distress. Eyes: Conjunctivae are normal. PERRL. EOMI. Head: Atraumatic. Neck: No stridor.    Cardiovascular: Normal rate, regular rhythm. Normal S1 and S2.  Good peripheral circulation. Respiratory: Normal respiratory effort without tachypnea or retractions. Lungs CTAB. Good air entry to the bases with no decreased or absent breath sounds. Gastrointestinal: Bowel sounds 4 quadrants. Soft and nontender to palpation. No guarding or rigidity. No palpable masses. No distention. Positive for right-sided CVA tenderness. Musculoskeletal: Full range of motion to all extremities. No gross deformities appreciated. No deformities noted to inspection of spine. Patient is nontender to palpation midline spinal processes. Patient is tender to palpation right lower thoracic upper lumbar paraspinal muscle region. Spasms are appreciated. Positive for CVA tenderness. Neurologic:  Normal speech and language. No gross focal neurologic deficits are appreciated.  Skin:  Skin is warm, dry and intact. No rash noted. Psychiatric: Mood and affect are normal. Speech and behavior are normal. Patient exhibits appropriate insight and judgement.   ____________________________________________   LABS (all labs ordered are listed, but only abnormal results are displayed)  Labs Reviewed  URINALYSIS COMPLETEWITH MICROSCOPIC ([facility] ONLY) - Abnormal; Notable for the following:       Result Value   Color, Urine YELLOW (*)    APPearance HAZY (*)    Protein, ur 100 (*)    Bacteria, UA MANY (*)    Squamous Epithelial / LPF 0-5 (*)    All other components within normal limits  COMPREHENSIVE METABOLIC PANEL - Abnormal; Notable for the following:    Potassium 3.3 (*)    All other components within normal limits  CBC - Abnormal; Notable for the following:    WBC 13.5 (*)    All other components within normal limits    ____________________________________________  EKG   ____________________________________________  RADIOLOGY Diamantina Providence Cuthriell, personally viewed and evaluated these images as part of my medical decision making, as well as reviewing the written report by the radiologist.  Ct Renal Stone Study  Result Date: 01/04/2016 CLINICAL DATA:  Low back pain since 01/01/2016. No known injury. Initial encounter. EXAM: CT ABDOMEN AND PELVIS WITHOUT CONTRAST TECHNIQUE: Multidetector CT imaging of the abdomen and pelvis was performed following the standard protocol without IV contrast. COMPARISON:  CT abdomen and pelvis 06/16/2011. FINDINGS: The lung bases are clear. No pleural or pericardial effusion. Heart size is normal. A 0.4 cm nonobstructing stone is seen in the lower pole of the right kidney. A punctate nonobstructing stone in the upper pole of the left kidney is also identified. Large left renal cyst measures 7.2 x 5.9 cm. There is no hydronephrosis on the right or left. No ureteral or urinary bladder stones are identified. A 1.1 cm cyst in the liver on image 18 is unchanged. The liver is  otherwise unremarkable. A few small stones are seen within the gallbladder. No evidence of cholecystitis. The adrenal glands, spleen and pancreas appear normal. Seminal vesicles, prostate gland and urinary bladder are unremarkable. Small fat containing inguinal hernias are seen, more prominent on the left. There is also a very small fat containing umbilical hernia. The stomach, small and large bowel and appendix appear normal. There is no lymphadenopathy or fluid. No lytic or sclerotic bony lesion is identified. IMPRESSION: No acute abnormality abdomen or pelvis. No finding to explain the patient's symptoms. Single small nonobstructing bilateral renal stones. A few small gallstones are identified without evidence of cholecystitis. Tiny umbilical hernia and small fat containing bilateral inguinal hernias. Electronically  Signed   By: Inge Rise M.D.   On: 01/04/2016 17:19    ____________________________________________    PROCEDURES  Procedure(s) performed:    Procedures    Medications  ketorolac (TORADOL) 30 MG/ML injection 30 mg (not administered)  diazepam (VALIUM) injection 5 mg (not administered)  sodium chloride 0.9 % bolus 1,000 mL (1,000 mLs Intravenous New Bag/Given 01/04/16 1716)  ondansetron (ZOFRAN) injection 4 mg (4 mg Intravenous Given 01/04/16 1718)  morphine 4 MG/ML injection 4 mg (4 mg Intravenous Given 01/04/16 1718)     ____________________________________________   INITIAL IMPRESSION / ASSESSMENT AND PLAN / ED COURSE  Pertinent labs & imaging results that were available during my care of the patient were reviewed by me and considered in my medical decision making (see chart for details).  Review of the [place] CSRS was performed in accordance of the [place] prior to dispensing any controlled drugs.  Clinical Course    Patient's diagnosis is consistent with Acute lumbar muscle spasms/strain. Initially, the patient's presentation was concerning for kidney stone. However, CT scan reveals no obstructing kidney stone or other causative abnormality for patient's symptoms. Patient is tender to palpation over the musculature. Spasms are appreciated. Labs are reassuring. No indication for acute UTI or pyelonephritis.. Patient will be discharged home with prescriptions for anti-inflammatories, muscle relaxers, and pain medication. Patient is to follow up with primary care provider as needed or otherwise directed. Patient is given ED precautions to return to the ED for any worsening or new symptoms.     ____________________________________________  FINAL CLINICAL IMPRESSION(S) / ED DIAGNOSES  Final diagnoses:  Muscle spasm of back      NEW MEDICATIONS STARTED DURING THIS VISIT:  New Prescriptions   MELOXICAM (MOBIC) 15 MG TABLET    Take 1 tablet (15 mg total) by mouth daily.    METHOCARBAMOL (ROBAXIN) 500 MG TABLET    Take 1 tablet (500 mg total) by mouth 4 (four) times daily.   TRAMADOL (ULTRAM) 50 MG TABLET    Take 1 tablet (50 mg total) by mouth every 6 (six) hours as needed.        This chart was dictated using voice recognition software/Dragon. Despite best efforts to proofread, errors can occur which can change the meaning. Any change was purely unintentional.    Darletta Moll, PA-C 01/04/16 1753    Delman Kitten, MD 01/05/16 0030

## 2016-09-13 ENCOUNTER — Emergency Department
Admission: EM | Admit: 2016-09-13 | Discharge: 2016-09-13 | Disposition: A | Discharge status: Home / Self Care | Payer: Medicaid Other | Attending: Emergency Medicine | Admitting: Emergency Medicine

## 2016-09-13 ENCOUNTER — Encounter: Payer: Self-pay | Admitting: Emergency Medicine

## 2016-09-13 DIAGNOSIS — F17210 Nicotine dependence, cigarettes, uncomplicated: Secondary | ICD-10-CM | POA: Insufficient documentation

## 2016-09-13 DIAGNOSIS — M62830 Muscle spasm of back: Secondary | ICD-10-CM | POA: Insufficient documentation

## 2016-09-13 MED ORDER — TIZANIDINE HCL 4 MG PO TABS: 4.0000 mg | ORAL_TABLET | Freq: Three times a day (TID) | ORAL | 0 refills | Status: DC

## 2016-09-13 MED ORDER — KETOROLAC TROMETHAMINE 60 MG/2ML IM SOLN
60.0000 mg | Freq: Once | INTRAMUSCULAR | Status: AC
  Administered 2016-09-13: 60 mg via INTRAMUSCULAR
  Filled 2016-09-13: qty 2

## 2016-09-13 MED ORDER — KETOROLAC TROMETHAMINE 10 MG PO TABS: 10.0000 mg | ORAL_TABLET | Freq: Four times a day (QID) | ORAL | 0 refills | Status: DC | PRN

## 2016-09-13 MED ORDER — ORPHENADRINE CITRATE 30 MG/ML IJ SOLN
60.0000 mg | Freq: Two times a day (BID) | INTRAMUSCULAR | Status: DC
  Administered 2016-09-13: 60 mg via INTRAMUSCULAR
  Filled 2016-09-13: qty 2

## 2016-09-13 NOTE — ED Triage Notes (Signed)
Pt to ed with c/o back pain that radiates down right leg.  Pt rates pain 10/10.  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

## 2016-09-13 NOTE — ED Provider Notes (Signed)
[facility] Emergency Department Provider Note ____________________________________________  Time seen: Approximately 6:09 PM  I have reviewed the triage vital signs and the nursing notes.   HISTORY  Chief Complaint Back Pain    HPI Thomas [name] is a 51 y.o. male who presents to the emergency department for evaluationof back pain. 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 He states that his back feels as if it has a "big knot" on the right side. He has taken ibuprofen, Robaxin, and some other type of pain medicine without relief. Pain occasionally will radiate to the anterior portion of the right leg. He states that the pain "catches" with certain movements. He denies any urinary symptoms.  Past Medical History:  Diagnosis Date  . Bipolar 1 disorder [facility])     Patient Active Problem List   Diagnosis Date Noted  . Peritonsillar abscess 05/22/2015  . Tonsil, abscess 05/22/2015    Past Surgical History:  Procedure Laterality Date  . HERNIA REPAIR    . INCISION AND DRAINAGE OF PERITONSILLAR ABCESS N/A 05/22/2015   Procedure: INCISION AND DRAINAGE OF PERITONSILLAR ABCESS;  Surgeon: Beverly Gust, MD;  Location: [facility] ORS;  Service: ENT;  Laterality: N/A;    Prior to Admission medications   Medication Sig Start Date End Date Taking? Authorizing Provider  ibuprofen (ADVIL,MOTRIN) 200 MG tablet Take 800 mg by mouth every 6 (six) hours as needed.    [provider]  ketorolac (TORADOL) 10 MG tablet Take 1 tablet (10 mg total) by mouth every 6 (six) hours as needed. 09/13/16   Kalliopi Coupland, Johnette Abraham B, FNP  methocarbamol (ROBAXIN) 500 MG tablet Take 1 tablet (500 mg total) by mouth 4 (four) times daily. 01/04/16   Cuthriell, Charline Bills, PA-C  oxyCODONE (OXY IR/ROXICODONE) 5 MG immediate release tablet Take 1  tablet (5 mg total) by mouth every 4 (four) hours as needed for moderate pain. 05/23/15   Fritzi Mandes, MD  predniSONE (STERAPRED UNI-PAK 21 TAB) 10 MG (21) TBPK tablet Take 1 tablet (10 mg total) by mouth PC lunch. Take as per instruction 05/23/15   Fritzi Mandes, MD  tiZANidine (ZANAFLEX) 4 MG tablet Take 1 tablet (4 mg total) by mouth 3 (three) times daily. 09/13/16   Nilam Quakenbush, Johnette Abraham B, FNP  traMADol (ULTRAM) 50 MG tablet Take 1 tablet (50 mg total) by mouth every 6 (six) hours as needed. 01/04/16   Cuthriell, Charline Bills, PA-C  venlafaxine XR (EFFEXOR-XR) 75 MG 24 hr capsule Take 1 capsule by mouth 2 (two) times daily. 08/20/14   [provider]    Allergies Sulfa antibiotics  Family History  Problem Relation Age of Onset  . Aneurysm Mother   . Heart disease Father     Social History Social History  Substance Use Topics  . Smoking status: Current Every Day Smoker    Packs/day: 2.00    Types: Cigarettes  . Smokeless tobacco: Never Used  . Alcohol use No    Review of Systems Constitutional: No recent illness. Cardiovascular: Denies chest pain or palpitations. Respiratory: Denies shortness of breath. Musculoskeletal: Pain in Right paraspinal muscles of the thoracic and upper lumbar area Skin: Negative for rash, wound, lesion. Neurological: Negative for focal weakness or numbness. Negative for saddle anesthesia or loss of bowel or bladder control.  ____________________________________________   PHYSICAL  EXAM:  VITAL SIGNS: ED Triage Vitals  Enc Vitals Group     BP 09/13/16 1725 (!) 173/92     Pulse Rate 09/13/16 1725 62     Resp 09/13/16 1725 18     Temp 09/13/16 1725 98.2 F (36.8 C)     Temp Source 09/13/16 1725 Oral     SpO2 09/13/16 1725 96 %     Weight 09/13/16 1724 200 lb (90.7 kg)     Height --      Head Circumference --      Peak Flow --      Pain Score 09/13/16 1723 10     Pain Loc --      Pain Edu? --      Excl. in [place]? --     Constitutional: Alert  and oriented. Well appearing and in no acute distress. Eyes: Conjunctivae are normal. EOMI. Head: Atraumatic. Neck: No stridor.  Respiratory: Normal respiratory effort.   Musculoskeletal: Palpable spasm over the thoracic paraspinal area on the right side.  Neurologic:  Normal speech and language. No gross focal neurologic deficits are appreciated. Speech is normal. No gait instability. Skin:  Skin is warm, dry and intact. Atraumatic. Psychiatric: Mood and affect are normal. Speech and behavior are normal.  ____________________________________________   LABS (all labs ordered are listed, but only abnormal results are displayed)  Labs Reviewed - No data to display ____________________________________________  RADIOLOGY  Not indicated.  ____________________________________________   PROCEDURES  Procedure(s) performed: None  ____________________________________________   INITIAL IMPRESSION / ASSESSMENT AND PLAN / ED COURSE  51 year old male presenting to the emergency department for treatment of back pain. Palpable muscle spasm is obvious on exam. While in the emergency department tonight, he was given an injection of Norflex and Toradol with significant relief of pain. He'll be given prescriptions for Zanaflex and Toradol to be taken for the next few days. He was given strict return precautions and advised that he should follow up with his primary care provider for if the symptoms continue to recur.  Pertinent labs & imaging results that were available during my care of the patient were reviewed by me and considered in my medical decision making (see chart for details).  _________________________________________   FINAL CLINICAL IMPRESSION(S) / ED DIAGNOSES  Final diagnoses:  Muscle spasm of back    Discharge Medication List as of 09/13/2016  7:16 PM    START taking these medications   Details  ketorolac (TORADOL) 10 MG tablet Take 1 tablet (10 mg total) by mouth every  6 (six) hours as needed., Starting Tue 09/13/2016, Print    tiZANidine (ZANAFLEX) 4 MG tablet Take 1 tablet (4 mg total) by mouth 3 (three) times daily., Starting Tue 09/13/2016, Print        If controlled substance prescribed during this visit, 12 month history viewed on the [place] prior to issuing an initial prescription for Schedule II or III opiod.    Victorino Dike, FNP 09/13/16 2310    Orbie Pyo, MD 09/14/16 [phone]

## 2016-09-13 NOTE — ED Notes (Signed)
See triage note  States he developed some lower back couple of weeks ago  He may have twisted wrong  states pain is radiates into right leg

## 2016-10-26 ENCOUNTER — Emergency Department (HOSPITAL_COMMUNITY)
Admission: EM | Admit: 2016-10-26 | Discharge: 2016-10-26 | Disposition: A | Discharge status: Home / Self Care | Payer: Self-pay | Attending: Emergency Medicine | Admitting: Emergency Medicine

## 2016-10-26 ENCOUNTER — Encounter (HOSPITAL_COMMUNITY): Payer: Self-pay | Admitting: Emergency Medicine

## 2016-10-26 DIAGNOSIS — Y939 Activity, unspecified: Secondary | ICD-10-CM | POA: Insufficient documentation

## 2016-10-26 DIAGNOSIS — Y998 Other external cause status: Secondary | ICD-10-CM | POA: Insufficient documentation

## 2016-10-26 DIAGNOSIS — S29012A Strain of muscle and tendon of back wall of thorax, initial encounter: Secondary | ICD-10-CM | POA: Insufficient documentation

## 2016-10-26 DIAGNOSIS — Y929 Unspecified place or not applicable: Secondary | ICD-10-CM | POA: Insufficient documentation

## 2016-10-26 DIAGNOSIS — Y33XXXA Other specified events, undetermined intent, initial encounter: Secondary | ICD-10-CM | POA: Insufficient documentation

## 2016-10-26 MED ORDER — TRIAMCINOLONE ACETONIDE 40 MG/ML IJ SUSP
40.0000 mg | Freq: Once | INTRAMUSCULAR | Status: DC
  Filled 2016-10-26 (×2): qty 1

## 2016-10-26 MED ORDER — BUPIVACAINE HCL 0.5 % IJ SOLN
10.0000 mL | Freq: Once | INTRAMUSCULAR | Status: DC
  Filled 2016-10-26: qty 10

## 2016-10-26 MED ORDER — BUPIVACAINE HCL (PF) 0.5 % IJ SOLN
30.0000 mL | Freq: Once | INTRAMUSCULAR | Status: AC
  Administered 2016-10-26: 30 mL
  Filled 2016-10-26: qty 30

## 2016-10-26 MED ORDER — METHYLPREDNISOLONE SODIUM SUCC 125 MG IJ SOLR
125.0000 mg | Freq: Once | INTRAMUSCULAR | Status: AC
  Administered 2016-10-26: 125 mg via INTRAMUSCULAR
  Filled 2016-10-26: qty 2

## 2016-10-26 NOTE — ED Triage Notes (Signed)
Pt arrives to ED in a back brace that he states was applied at [facility] hospital pt states he was told he was having "back spasms" and given medications. Pt reports report has moved into his upper back and is worse. Denies any numbness or tingling, pt is ambulatory at triage but hypertensive.

## 2016-10-26 NOTE — Discharge Instructions (Signed)
Continue medicine prescribed to you Try Lidocaine 4% patch over the counter (Salonpas, Aspercreme, IcyHot, etc) Follow up with orthopedics

## 2016-10-26 NOTE — ED Provider Notes (Signed)
[facility] DEPT Provider Note   CSN: [id number] Arrival date & time: 10/26/16  1135  By signing my name below, I, Hansel Feinstein, attest that this documentation has been prepared under the direction and in the presence of  Janetta Hora, PA-C. Electronically Signed: Hansel Feinstein, ED Scribe. 10/26/16. 1:02 PM.    History   Chief Complaint Chief Complaint  Patient presents with  . Back Pain    HPI Finneas Mathe is a 51 y.o. male who presents to the Emergency Department complaining of moderate, gradually worsening "spasming" right sided back pain for months, worsened in the last few days. Pt localizes his pain to one "knot" that causes pain radiation to the shoulders when his back "locks up". He reports associated numbness to the right leg that occurs when his back "catches". 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 He has been seen in the ED twice for the same complaint in the last month and has been given a back brace, Toradol and Zanaflex which has provided no relief. He also saw a chiropractor prior to visiting the ER which alleviated his pain temporarily. Pt states he has had imaging studies since his pain began. He states his pain is worsened when lying down. He denies bowel or bladder incontinence, saddle anesthesia.    The history is provided by the patient. No language interpreter was used.    Past Medical History:  Diagnosis Date  . Bipolar 1 disorder [facility])     Patient Active Problem List   Diagnosis Date Noted  . Peritonsillar abscess 05/22/2015  . Tonsil, abscess 05/22/2015    Past Surgical History:  Procedure Laterality Date  . HERNIA REPAIR    . INCISION AND DRAINAGE OF PERITONSILLAR ABCESS N/A 05/22/2015   Procedure: INCISION AND DRAINAGE OF PERITONSILLAR ABCESS;  Surgeon: Beverly Gust, MD;  Location: [facility] ORS;  Service: ENT;  Laterality: N/A;       Home Medications    Prior to  Admission medications   Medication Sig Start Date End Date Taking? Authorizing Provider  ibuprofen (ADVIL,MOTRIN) 200 MG tablet Take 800 mg by mouth every 6 (six) hours as needed.    [provider]  ketorolac (TORADOL) 10 MG tablet Take 1 tablet (10 mg total) by mouth every 6 (six) hours as needed. 09/13/16   Triplett, Johnette Abraham B, FNP  methocarbamol (ROBAXIN) 500 MG tablet Take 1 tablet (500 mg total) by mouth 4 (four) times daily. 01/04/16   Cuthriell, Charline Bills, PA-C  oxyCODONE (OXY IR/ROXICODONE) 5 MG immediate release tablet Take 1 tablet (5 mg total) by mouth every 4 (four) hours as needed for moderate pain. 05/23/15   Fritzi Mandes, MD  predniSONE (STERAPRED UNI-PAK 21 TAB) 10 MG (21) TBPK tablet Take 1 tablet (10 mg total) by mouth PC lunch. Take as per instruction 05/23/15   Fritzi Mandes, MD  tiZANidine (ZANAFLEX) 4 MG tablet Take 1 tablet (4 mg total) by mouth 3 (three) times daily. 09/13/16   Triplett, Johnette Abraham B, FNP  traMADol (ULTRAM) 50 MG tablet Take 1 tablet (50 mg total) by mouth every 6 (six) hours as needed. 01/04/16   Cuthriell, Charline Bills, PA-C  venlafaxine XR (EFFEXOR-XR) 75 MG 24 hr capsule Take 1 capsule by mouth 2 (two) times daily. 08/20/14   [provider]    Family History Family History  Problem Relation Age of Onset  . Aneurysm Mother   . Heart disease Father  Social History Social History  Substance Use Topics  . Smoking status: Current Every Day Smoker    Packs/day: 2.00    Types: Cigarettes  . Smokeless tobacco: Never Used  . Alcohol use No     Allergies   Sulfa antibiotics   Review of Systems Review of Systems  Gastrointestinal:       No bowel incontinence   Genitourinary: Negative for difficulty urinating.       No bladder incontinence or saddle anesthesia   Musculoskeletal: Positive for back pain.  Neurological: Positive for numbness.  All other systems reviewed and are negative.    Physical Exam Updated Vital Signs BP (!) 168/103  (BP Location: Left Arm)   Pulse (!) 56   Temp 97.5 F (36.4 C) (Oral)   Resp 16   SpO2 95%   Physical Exam  Constitutional: He is oriented to person, place, and time. He appears well-developed and well-nourished. No distress.  HENT:  Head: Normocephalic and atraumatic.  Eyes: Conjunctivae are normal. Pupils are equal, round, and reactive to light. Right eye exhibits no discharge. Left eye exhibits no discharge. No scleral icterus.  Neck: Normal range of motion.  Cardiovascular: Normal rate.   Pulmonary/Chest: Effort normal. No respiratory distress.  Abdominal: He exhibits no distension.  Musculoskeletal: Normal range of motion.  Back: Inspection: No masses, deformity, or rash Palpation: No midline spinal tenderness. No paraspinal muscle tenderness. Point tenderness over the right mid-back Strength: 5/5 in lower extremities and normal plantar and dorsiflexion Sensation: Intact sensation with light touch in lower extremities bilaterally SLR: Negative seated straight leg raise Gait: Normal gait   Neurological: He is alert and oriented to person, place, and time.  Skin: Skin is warm and dry.  Psychiatric: He has a normal mood and affect. His behavior is normal.  Nursing note and vitals reviewed.    ED Treatments / Results   DIAGNOSTIC STUDIES: Oxygen Saturation is 95% on RA, adequate by my interpretation.    COORDINATION OF CARE: 12:59 PM Discussed treatment plan with pt at bedside which includes supportive care, trigger point injection and pt agreed to plan.    Labs (all labs ordered are listed, but only abnormal results are displayed) Labs Reviewed - No data to display  EKG  EKG Interpretation None       Radiology No results found.  Procedures Procedures (including critical care time)  A trigger point injection was performed at the site of maximal tenderness using 0.5% plain Bupivicaine and 125mg  Solu-medrol. This was well tolerated, and followed by mild relief  of pain.   Medications Ordered in ED Medications  bupivacaine (MARCAINE) 0.5 % injection 30 mL (30 mLs Infiltration Given by Other 10/26/16 1335)  methylPREDNISolone sodium succinate (SOLU-MEDROL) 125 mg/2 mL injection 125 mg (125 mg Intramuscular Given by Other 10/26/16 1335)     Initial Impression / Assessment and Plan / ED Course  I have reviewed the triage vital signs and the nursing notes.  Pertinent labs & imaging results that were available during my care of the patient were reviewed by me and considered in my medical decision making (see chart for details).  51 year old male with muscle strain/spasm of right thoracic back. He has no red flags. He is very frustrated that the pain is ongoing which is understandable. He has been seen multiple times in the ED for this with minimal relief and is on NSAIDs and muscle relaxers. I think he is re-aggravating his back due to his line of  work which I explained to him. He has point tenderness therefore I offered him a trigger point injection which he tolerated well. Hopefully this will help him. I also stressed the importance of following up with Orthopedics for ongoing care. He expressed understanding.  Final Clinical Impressions(s) / ED Diagnoses   Final diagnoses:  Muscle strain of right upper back, initial encounter    New Prescriptions New Prescriptions   No medications on file    I personally performed the services described in this documentation, which was scribed in my presence. The recorded information has been reviewed and is accurate.    Recardo Evangelist, PA-C 10/27/16 1059    Isla Pence, MD 10/29/16 [phone]

## 2016-10-31 ENCOUNTER — Emergency Department
Admission: EM | Admit: 2016-10-31 | Discharge: 2016-10-31 | Disposition: A | Discharge status: Home / Self Care | Payer: Self-pay | Attending: Emergency Medicine | Admitting: Emergency Medicine

## 2016-10-31 ENCOUNTER — Encounter: Payer: Self-pay | Admitting: Emergency Medicine

## 2016-10-31 DIAGNOSIS — X30XXXA Exposure to excessive natural heat, initial encounter: Secondary | ICD-10-CM | POA: Insufficient documentation

## 2016-10-31 DIAGNOSIS — T675XXA Heat exhaustion, unspecified, initial encounter: Secondary | ICD-10-CM | POA: Insufficient documentation

## 2016-10-31 DIAGNOSIS — F17210 Nicotine dependence, cigarettes, uncomplicated: Secondary | ICD-10-CM | POA: Insufficient documentation

## 2016-10-31 HISTORY — DX: Hypoglycemia, unspecified: E16.2

## 2016-10-31 LAB — BASIC METABOLIC PANEL
ANION GAP: 11 (ref 5–15)
BUN: 15 mg/dL (ref 6–20)
CALCIUM: 9.3 mg/dL (ref 8.9–10.3)
CHLORIDE: 106 mmol/L (ref 101–111)
CO2: 22 mmol/L (ref 22–32)
Creatinine, Ser: 1 mg/dL (ref 0.61–1.24)
GFR calc non Af Amer: >60 mL/min (ref >60)
GLUCOSE: 120 mg/dL — AB (ref 65–99)
POTASSIUM: 3.1 mmol/L — AB (ref 3.5–5.1)
Sodium: 139 mmol/L (ref 135–145)

## 2016-10-31 LAB — CBC
HCT: 47.7 % (ref 40.0–52.0)
HEMOGLOBIN: 15.9 g/dL (ref 13.0–18.0)
MCH: 26.6 pg (ref 26.0–34.0)
MCHC: 33.3 g/dL (ref 32.0–36.0)
MCV: 79.8 fL — AB (ref 80.0–100.0)
Platelets: 231 10*3/uL (ref 150–440)
RBC: 5.98 MIL/uL — AB (ref 4.40–5.90)
RDW: 14.6 % — ABNORMAL HIGH (ref 11.5–14.5)
WBC: 13 10*3/uL — ABNORMAL HIGH (ref 3.8–10.6)

## 2016-10-31 LAB — CK: CK TOTAL: 188 U/L (ref 49–397)

## 2016-10-31 LAB — GLUCOSE, CAPILLARY: Glucose-Capillary: 122 mg/dL — ABNORMAL HIGH (ref 65–99)

## 2016-10-31 MED ORDER — SODIUM CHLORIDE 0.9 % IV SOLN
Freq: Once | INTRAVENOUS | Status: AC
Start: 2016-10-31 — End: 2016-10-31
  Administered 2016-10-31: 19:00:00 via INTRAVENOUS

## 2016-10-31 MED ORDER — ONDANSETRON HCL 4 MG/2ML IJ SOLN
4.0000 mg | Freq: Once | INTRAMUSCULAR | Status: AC | PRN
  Administered 2016-10-31: 4 mg via INTRAVENOUS
  Filled 2016-10-31: qty 2

## 2016-10-31 MED ORDER — ONDANSETRON HCL 4 MG/2ML IJ SOLN
4.0000 mg | Freq: Once | INTRAMUSCULAR | Status: DC
  Filled 2016-10-31: qty 2

## 2016-10-31 NOTE — ED Provider Notes (Signed)
[facility] Emergency Department Provider Note       Time seen: ----------------------------------------- 7:15 PM on 10/31/2016 -----------------------------------------     I have reviewed the triage vital signs and the nursing notes.   HISTORY   Chief Complaint Weakness and Heat Exposure    HPI Thomas [name] is a 51 y.o. male who presents to the ED for in the event that occurred at work earlier this afternoon. Patient states he works in a heated environment and about 1 hour ago he became pale and diaphoretic with nausea vomiting. Patient was brought in diaphoretic, notes she's been eating and drinking normally today. Patient was near syncopal and the event occurred. He denies significant complaints at this time.   Past Medical History:  Diagnosis Date  . Bipolar 1 disorder ([place])   . Hypoglycemia     Patient Active Problem List   Diagnosis Date Noted  . Peritonsillar abscess 05/22/2015  . Tonsil, abscess 05/22/2015    Past Surgical History:  Procedure Laterality Date  . HERNIA REPAIR    . INCISION AND DRAINAGE OF PERITONSILLAR ABCESS N/A 05/22/2015   Procedure: INCISION AND DRAINAGE OF PERITONSILLAR ABCESS;  Surgeon: Beverly Gust, MD;  Location: [facility] ORS;  Service: ENT;  Laterality: N/A;    Allergies Sulfa antibiotics  Social History Social History  Substance Use Topics  . Smoking status: Current Every Day Smoker    Packs/day: 2.00    Types: Cigarettes  . Smokeless tobacco: Never Used  . Alcohol use No    Review of Systems Constitutional: Negative for fever. Eyes: Negative for vision changes ENT:  Negative for congestion, sore throat Cardiovascular: Negative for chest pain. Respiratory: Negative for shortness of breath. Gastrointestinal: Negative for abdominal pain, vomiting and diarrhea. Genitourinary: Negative for dysuria. Musculoskeletal: Negative for back pain. Skin: Negative for rash.Positive for  diaphoresis Neurological: Negative for headaches, positive for generalized weakness  All systems negative/normal/unremarkable except as stated in the HPI  ____________________________________________   PHYSICAL EXAM:  VITAL SIGNS: ED Triage Vitals  Enc Vitals Group     BP 10/31/16 1713 (!) 179/87     Pulse Rate 10/31/16 1713 (!) 57     Resp 10/31/16 1713 20     Temp 10/31/16 1713 98.2 F (36.8 C)     Temp Source 10/31/16 1713 Oral     SpO2 10/31/16 1713 94 %     Weight 10/31/16 1714 210 lb (95.3 kg)     Height 10/31/16 1714 6' (1.829 m)     Head Circumference --      Peak Flow --      Pain Score 10/31/16 1710 0     Pain Loc --      Pain Edu? --      Excl. in [place]? --     Constitutional: Alert and oriented. Well appearing and in no distress. Eyes: Conjunctivae are normal. Normal extraocular movements. ENT   Head: Normocephalic and atraumatic.   Nose: No congestion/rhinnorhea.   Mouth/Throat: Mucous membranes are moist.   Neck: No stridor. Cardiovascular: Normal rate, regular rhythm. No murmurs, rubs, or gallops. Respiratory: Normal respiratory effort without tachypnea nor retractions. Breath sounds are clear and equal bilaterally. No wheezes/rales/rhonchi. Gastrointestinal: Soft and nontender. Normal bowel sounds Musculoskeletal: Nontender with normal range of motion in extremities. No lower extremity tenderness nor edema. Neurologic:  Normal speech and language. No gross focal neurologic deficits are appreciated.  Skin:  Skin is warm, dry and intact. No rash noted. Psychiatric: Mood and affect are  normal. Speech and behavior are normal.  ____________________________________________  EKG: Interpreted by me.Sinus rhythm rate of 64 bpm, normal PR interval, normal QRS, normal QT.  ____________________________________________  ED COURSE:  Pertinent labs & imaging results that were available during my care of the patient were reviewed by me and considered in my  medical decision making (see chart for details). Patient presents for likely heat related illness, we will assess and imaging as indicated. Patient will receive IV fluids and antiemetics.   Procedures ____________________________________________   LABS (pertinent positives/negatives)  Labs Reviewed  BASIC METABOLIC PANEL - Abnormal; Notable for the following:       Result Value   Potassium 3.1 (*)    Glucose, Bld 120 (*)    All other components within normal limits  CBC - Abnormal; Notable for the following:    WBC 13.0 (*)    RBC 5.98 (*)    MCV 79.8 (*)    RDW 14.6 (*)    All other components within normal limits  GLUCOSE, CAPILLARY - Abnormal; Notable for the following:    Glucose-Capillary 122 (*)    All other components within normal limits  CK  URINALYSIS, COMPLETE (UACMP) WITH MICROSCOPIC  CBG MONITORING, ED  ____________________________________________  FINAL ASSESSMENT AND PLAN  Heat exhaustion  Plan: Patient's labs were dictated above. Patient had presented for Symptoms secondary to heat exhaustion. Currently he is feeling improved, he was given IV fluids as well as antiemetics. He is stable for outpatient follow-up.   Earleen Newport, MD   Note: This note was generated in part or whole with voice recognition software. Voice recognition is usually quite accurate but there are transcription errors that can and very often do occur. I apologize for any typographical errors that were not detected and corrected.     Earleen Newport, MD 10/31/16 2014

## 2016-10-31 NOTE — ED Triage Notes (Signed)
Pt arrived via EMS from work. 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 Per EMS about 1 hour ago patient became pale and diaphoretic and started having nausea and vomiting.  Pt has hx of hypoglycemia and was 83 with EMS. Pt is diaphoretic in triage. Pt denies any pain at this time. Pt states he has been eating and drinking today.

## 2016-10-31 NOTE — ED Notes (Signed)
Pt actively vomiting in triage x 2

## 2017-03-06 IMAGING — CT CT NECK W/ CM
4 of 5 series · 15 of 33 positions shown, 17 images · IV contrast (omnipaque)
Comparison: None.

CLINICAL DATA: Left tonsillar swelling and vocal difficulty

EXAM:
CT NECK WITH CONTRAST
TECHNIQUE: Multidetector CT imaging of the neck was performed using the
standard protocol following the bolus administration of intravenous
contrast.
CONTRAST:  75mL OMNIPAQUE IOHEXOL 300 MG/ML  SOLN

[Series 2: axial neck · axial · 0.62mm/px · z∈[-206,-42]mm · 4 of 138 slices shown, 5 images]
[im 28/138  soft-tissue]
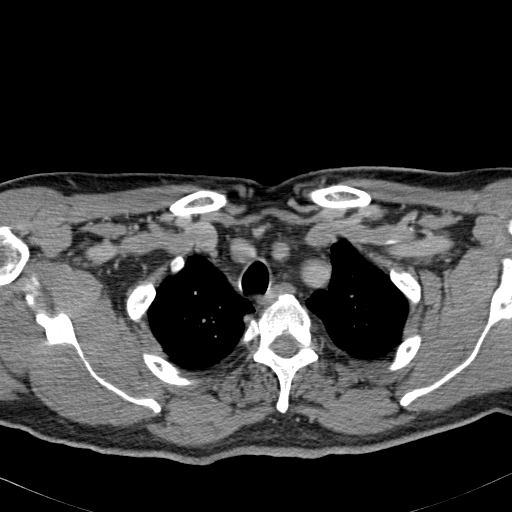
[im 28/138  bone]
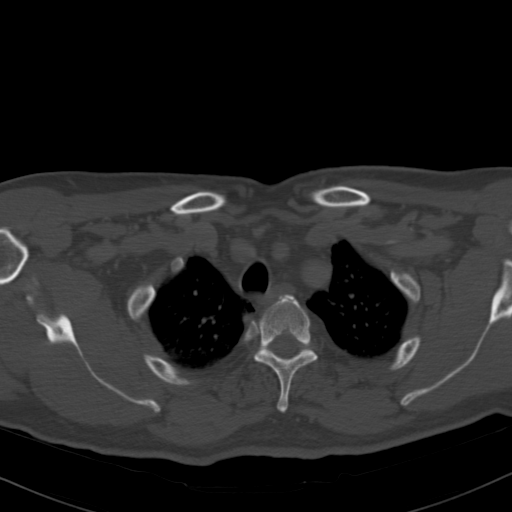
[im 55/138  bone]
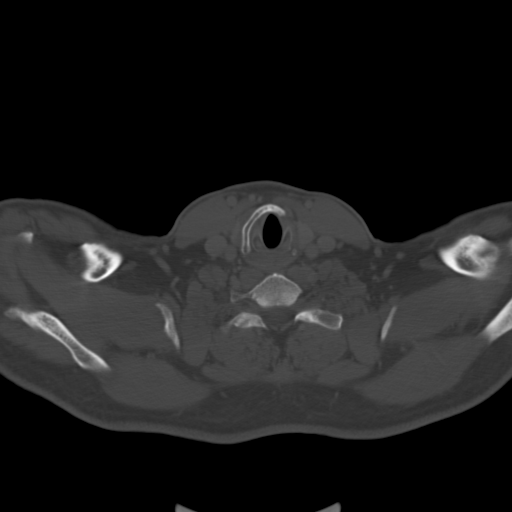
[im 83/138  bone]
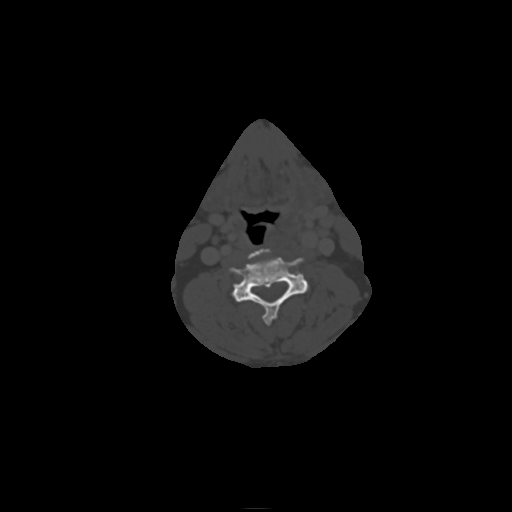
[im 110/138  bone]
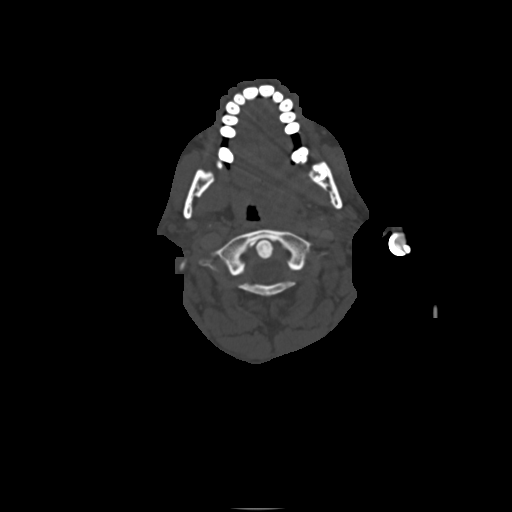

[Series 4: sag neck · sagittal · 0.53mm/px · 5 of 83 slices shown, 6 images]
[im 28/83  bone]
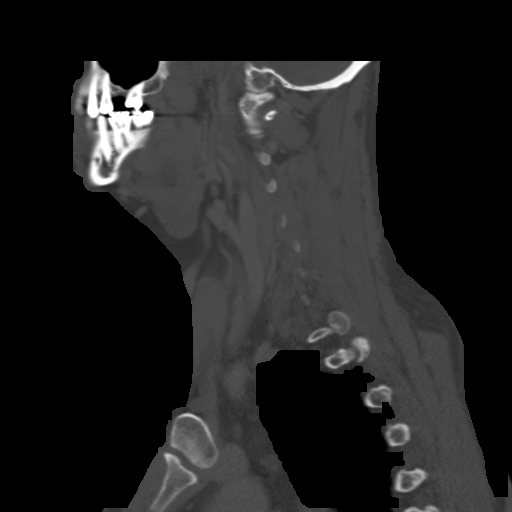
[im 35/83  bone]
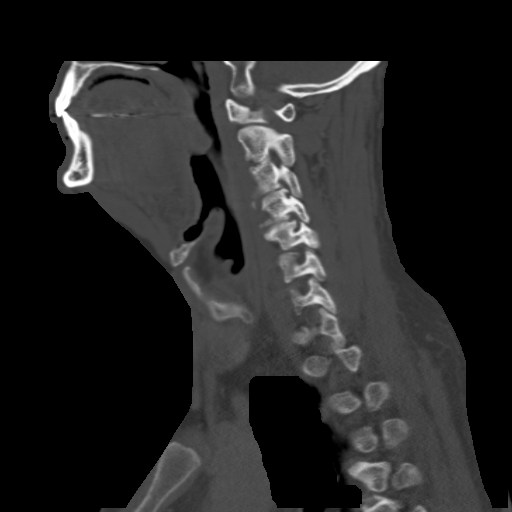
[im 42/83  soft-tissue]
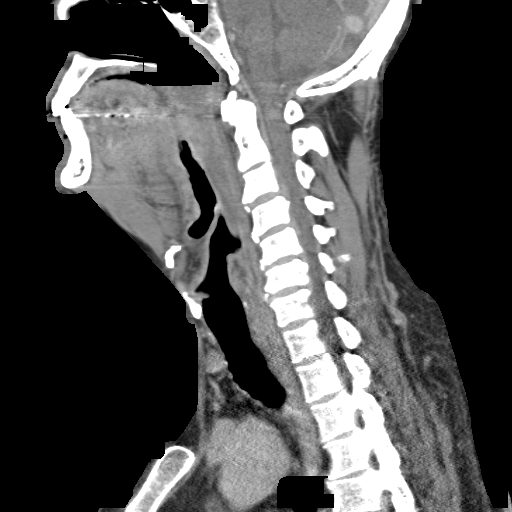
[im 42/83  bone]
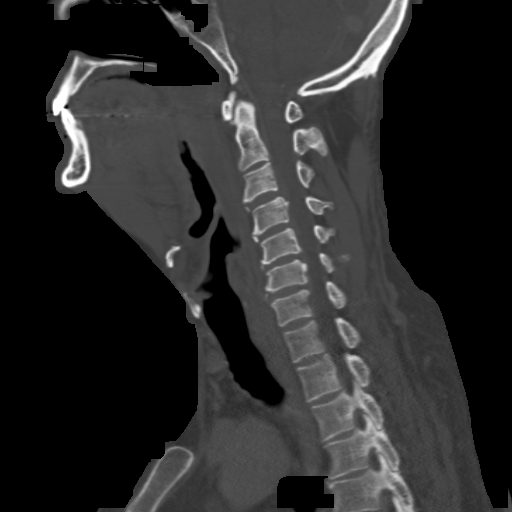
[im 48/83  bone]
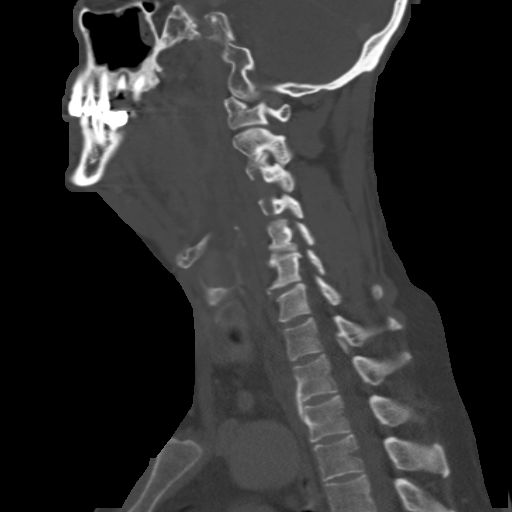
[im 55/83  bone]
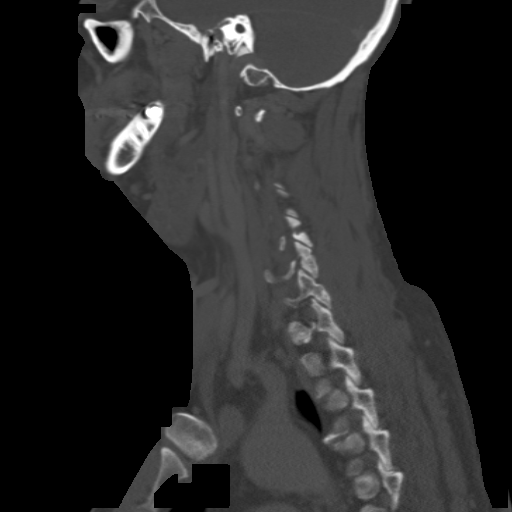

[Series 5: cor neck · coronal · 0.53mm/px · 3 of 130 slices shown]
[im 26/130  bone]
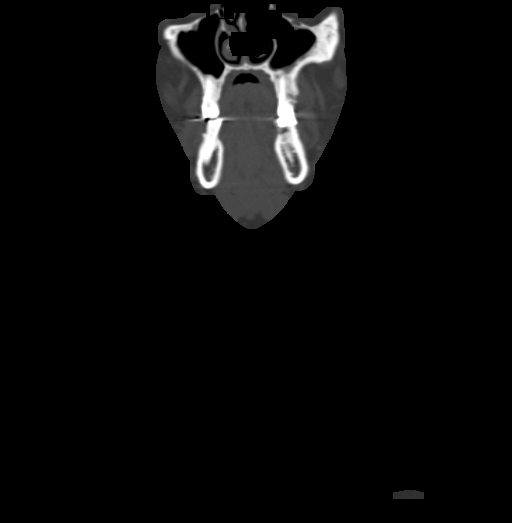
[im 52/130  bone]
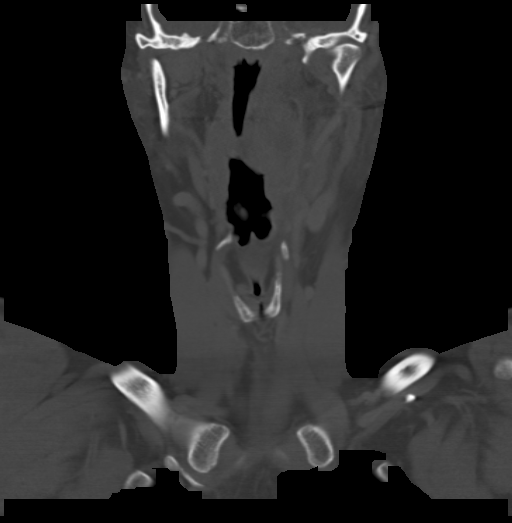
[im 78/130  bone]
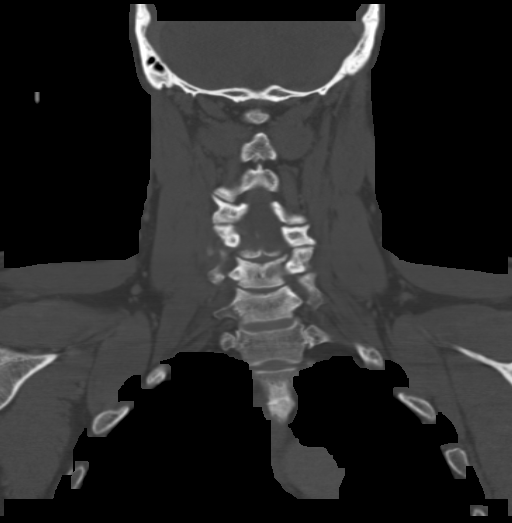

[Series 6: ax oropharynx · axial · 0.45mm/px · z∈[-234,-116]mm · 3 of 153 slices shown]
[im 31/153  bone]
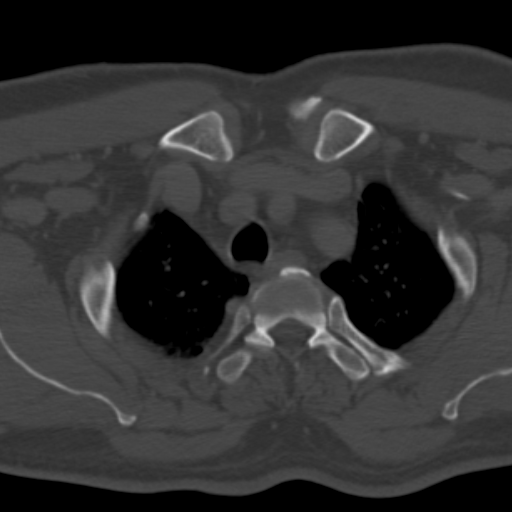
[im 61/153  bone]
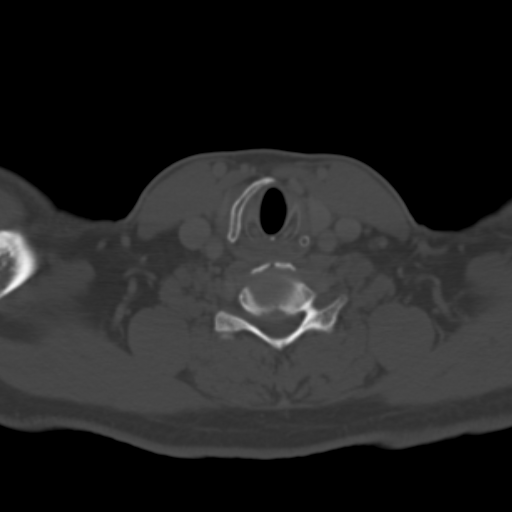
[im 92/153  bone]
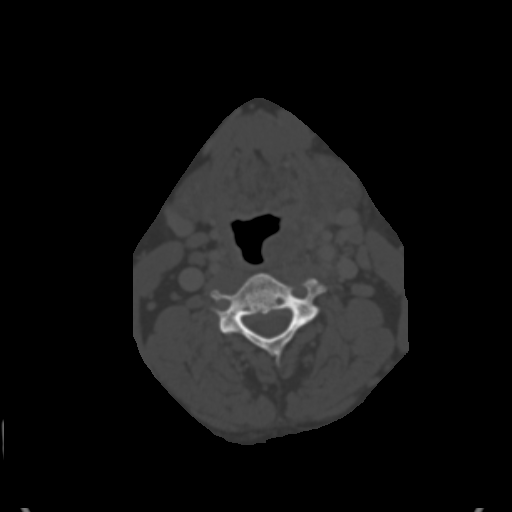

[15 of 33 positions shown; findings below may reference images not displayed]

FINDINGS: Pharynx and larynx: There is a lesion arising in the left
peritonsillar area measuring 4.5 x 3.0 x 2.5 cm which is causing
impression on the upper pharyngeal airway with slight deviation of
the upper pharyngeal airway to the right. The borders of this area
are ill-defined with a relative decreased attenuation central area
and a thick peripheral area of attenuation and similar to the
surrounding soft tissues. In addition, there is effacement of the
left piriform sinus with a similar appearing area of decreased
central region with a thinner area of slightly higher attenuation
measuring 1.4 x 1.1 x 1.0 cm. No laryngeal mass or inflammation is
seen. The epiglottis and aryepiglottic folds appear normal. Tongue
and tongue base regions appear normal. The prevertebral soft tissues
are normal. There remainder of the pharyngeal and visualized
tracheal air column appear normal.

Salivary glands: Salivary glands appear symmetric and normal
bilaterally.

Thyroid: There is a 4 x 4 mm focus of decreased attenuation in the
left lobe of the thyroid. Thyroid otherwise appears normal.

Lymph nodes: There is a 1.3 x 1.1 cm lymph node just posterior to
the left jugular vein and deep to the sternocleidomastoid muscle at
the level of C5. No other lymph node prominence is appreciable on
this study.

Orbits: Only the inferior orbits are visualized. The visualized
intraorbital regions appear symmetric and normal bilaterally.

Mastoids and visualized paranasal sinuses: There is a small
retention cyst in the inferior posterior left maxillary antrum.
Visualized paranasal sinuses otherwise appear clear. Mastoids are
clear bilaterally.

Skeleton: There is degenerative change in the cervical spine. There
are no blastic or lytic bone lesions.

There is mild bullous disease in the apices bilaterally. There is no
mass or infiltrate in the visualized upper lung zone regions. There
is no adenopathy in the visualized upper thoracic region.
IMPRESSION: There is a focal lesion arising from the left tonsil causing mild
rightward deviation of the upper pharyngeal air column. This area
has a lucent central area with a thick peripheral region measuring
4.5 x 3.0 x 2.5 cm. Suspect peritonsillar abscess, although neoplasm
is a differential consideration. There is a second smaller lesion
appearing somewhat similarly at the level of the left piriform
sinus, concerning for a second focus of infection. Again, neoplasm
in this area is a differential consideration. Both of these areas
warrant direct visualization. There is a single mildly prominent
lymph node deep to the sternocleidomastoid muscle suggest posterior
to the left jugular vein at the level of C5. No other lymph node
prominence seen.

Small retention cysts in the inferior posterior left maxillary
antrum. Visualized paranasal sinuses otherwise clear. Mastoids clear
bilaterally.

Degenerative change in cervical spine.

Study otherwise unremarkable.

## 2018-02-23 ENCOUNTER — Encounter (HOSPITAL_COMMUNITY): Payer: Self-pay

## 2018-02-23 ENCOUNTER — Other Ambulatory Visit: Payer: Self-pay

## 2018-02-23 ENCOUNTER — Emergency Department (HOSPITAL_COMMUNITY)
Admission: EM | Admit: 2018-02-23 | Discharge: 2018-02-23 | Disposition: A | Discharge status: Home / Self Care | Payer: Self-pay | Attending: Emergency Medicine | Admitting: Emergency Medicine

## 2018-02-23 ENCOUNTER — Emergency Department (HOSPITAL_COMMUNITY): Payer: Self-pay

## 2018-02-23 DIAGNOSIS — J029 Acute pharyngitis, unspecified: Secondary | ICD-10-CM | POA: Insufficient documentation

## 2018-02-23 DIAGNOSIS — Z79899 Other long term (current) drug therapy: Secondary | ICD-10-CM | POA: Insufficient documentation

## 2018-02-23 DIAGNOSIS — I10 Essential (primary) hypertension: Secondary | ICD-10-CM | POA: Insufficient documentation

## 2018-02-23 DIAGNOSIS — R05 Cough: Secondary | ICD-10-CM | POA: Insufficient documentation

## 2018-02-23 DIAGNOSIS — H9201 Otalgia, right ear: Secondary | ICD-10-CM | POA: Insufficient documentation

## 2018-02-23 DIAGNOSIS — J3489 Other specified disorders of nose and nasal sinuses: Secondary | ICD-10-CM | POA: Insufficient documentation

## 2018-02-23 DIAGNOSIS — R070 Pain in throat: Secondary | ICD-10-CM | POA: Insufficient documentation

## 2018-02-23 DIAGNOSIS — F17210 Nicotine dependence, cigarettes, uncomplicated: Secondary | ICD-10-CM | POA: Insufficient documentation

## 2018-02-23 HISTORY — DX: Essential (primary) hypertension: I10

## 2018-02-23 MED ORDER — CLINDAMYCIN HCL 150 MG PO CAPS: 300.0000 mg | ORAL_CAPSULE | Freq: Three times a day (TID) | ORAL | 0 refills | Status: AC

## 2018-02-23 MED ORDER — NAPROXEN 500 MG PO TABS
500.0000 mg | ORAL_TABLET | Freq: Once | ORAL | Status: AC
  Administered 2018-02-23: 500 mg via ORAL
  Filled 2018-02-23: qty 1

## 2018-02-23 MED ORDER — NAPROXEN 500 MG PO TABS: 500.0000 mg | ORAL_TABLET | Freq: Two times a day (BID) | ORAL | 0 refills | Status: DC

## 2018-02-23 MED ORDER — FLUTICASONE PROPIONATE 50 MCG/ACT NA SUSP: 1.0000 | Freq: Every day | NASAL | 0 refills | Status: DC

## 2018-02-23 NOTE — ED Triage Notes (Signed)
Pt here w/family.  States he's been fighting a chest cold x few weeks, taking nyquil/dayquil.  Last 2-3 days has had Rt ear pain and productive cough.  States pain shoots up into the RT ear when he swallows.  Denies any other symptoms.

## 2018-02-23 NOTE — Discharge Instructions (Addendum)
You are seen in the emergency department today for upper respiratory infection type symptoms.  Your chest x-ray did not show pneumonia or any other concerning findings.  We are treating you for a bacterial infection of the throat with clindamycin and antibiotic.  We are also giving a prescription for naproxen to help with pain and swelling and Flonase to help with congestion. - Clindamycin- antibiotic- take 2 pills three times per day - Flonase- nasal spray- use once in each nostril daily.  - Naproxen is a nonsteroidal anti-inflammatory medication that will help with pain and swelling. Be sure to take this medication as prescribed with food, 1 pill every 12 hours,  It should be taken with food, as it can cause stomach upset, and more seriously, stomach bleeding. Do not take other nonsteroidal anti-inflammatory medications with this such as Advil, Motrin, Aleve, Mobic, Goodie Powder, or Motrin.     You make take Tylenol per over the counter dosing with these medications.   We have prescribed you new medication(s) today. Discuss the medications prescribed today with your pharmacist as they can have adverse effects and interactions with your other medicines including over the counter and prescribed medications. Seek medical evaluation if you start to experience new or abnormal symptoms after taking one of these medicines, seek care immediately if you start to experience difficulty breathing, feeling of your throat closing, facial swelling, or rash as these could be indications of a more serious allergic reaction  Please follow-up with your primary care provider in the next 3 to 5 days for reevaluation.  Return to the ER for new or worsening symptoms including but not limited to worsening pain, inability to swallow, inability to move your neck, trouble opening her mouth, change your voice, trouble breathing, fever, or any other concerns.  Additionally have your blood pressure rechecked by primary care  provider within the next 1 to 2 weeks as it was elevated in the ER today. Vitals:   02/23/18 2014  BP: (!) 151/82  Pulse: 72  Resp: 16  Temp: 97.9 F (36.6 C)  SpO2: 96%

## 2018-02-23 NOTE — ED Provider Notes (Signed)
[facility] DEPT Provider Note   CSN: [id number] Arrival date & time: 02/23/18  2008     History   Chief Complaint Chief Complaint  Patient presents with  . Otalgia    RT side  . Cough    productive-yellow    HPI Thomas [name] is a 52 y.o. male with a history of tobacco abuse, hypertension, prior PTA s/p I&D in 2017, and bipolar 1 disorder who presents to the emergency department with complaints of URI sxs for the past 2-3 weeks which seemed to significant worsen over the past 3 days.  Patient reports congestion, rhinorrhea, right ear pain, sore throat, and productive cough with yellow to green mucus sputum.  He states that initially DayQuil/NyQuil were controlling his symptoms well until over the past few days they seem to stop working.  He states that he is having pain mostly to the right ear is moderate to severe in nature, worse with swallowing, no alleviating factors.  No specific pain medications taken.  He states that this does not feel similar to prior peritonsillar abscess.  He denies fever, chills, vomiting, chest pain, dyspnea, or abdominal pain.  HPI  Past Medical History:  Diagnosis Date  . Bipolar 1 disorder ([place])   . Hypertension   . Hypoglycemia     Patient Active Problem List   Diagnosis Date Noted  . Peritonsillar abscess 05/22/2015  . Tonsil, abscess 05/22/2015    Past Surgical History:  Procedure Laterality Date  . HERNIA REPAIR    . INCISION AND DRAINAGE OF PERITONSILLAR ABCESS N/A 05/22/2015   Procedure: INCISION AND DRAINAGE OF PERITONSILLAR ABCESS;  Surgeon: Beverly Gust, MD;  Location: [facility] ORS;  Service: ENT;  Laterality: N/A;        Home Medications    Prior to Admission medications   Medication Sig Start Date End Date Taking? Authorizing Provider  ibuprofen (ADVIL,MOTRIN) 200 MG tablet Take 800 mg by mouth every 6 (six) hours as needed.    [provider]  ketorolac (TORADOL) 10 MG tablet Take 1  tablet (10 mg total) by mouth every 6 (six) hours as needed. 09/13/16   Triplett, Johnette Abraham B, FNP  methocarbamol (ROBAXIN) 500 MG tablet Take 1 tablet (500 mg total) by mouth 4 (four) times daily. 01/04/16   Cuthriell, Charline Bills, PA-C  oxyCODONE (OXY IR/ROXICODONE) 5 MG immediate release tablet Take 1 tablet (5 mg total) by mouth every 4 (four) hours as needed for moderate pain. 05/23/15   Fritzi Mandes, MD  predniSONE (STERAPRED UNI-PAK 21 TAB) 10 MG (21) TBPK tablet Take 1 tablet (10 mg total) by mouth PC lunch. Take as per instruction 05/23/15   Fritzi Mandes, MD  tiZANidine (ZANAFLEX) 4 MG tablet Take 1 tablet (4 mg total) by mouth 3 (three) times daily. 09/13/16   Triplett, Johnette Abraham B, FNP  traMADol (ULTRAM) 50 MG tablet Take 1 tablet (50 mg total) by mouth every 6 (six) hours as needed. 01/04/16   Cuthriell, Charline Bills, PA-C  venlafaxine XR (EFFEXOR-XR) 75 MG 24 hr capsule Take 1 capsule by mouth 2 (two) times daily. 08/20/14   [provider]    Family History Family History  Problem Relation Age of Onset  . Aneurysm Mother   . Heart disease Father     Social History Social History   Tobacco Use  . Smoking status: Current Every Day Smoker    Packs/day: 2.00    Types: Cigarettes  . Smokeless tobacco: Never Used  Substance Use Topics  .  Alcohol use: No  . Drug use: No     Allergies   Sulfa antibiotics   Review of Systems Review of Systems  Constitutional: Negative for chills and fever.  HENT: Positive for congestion, ear pain, rhinorrhea, sore throat and trouble swallowing (painful, but able). Negative for drooling, ear discharge, hearing loss, nosebleeds and voice change.   Respiratory: Positive for cough. Negative for shortness of breath.   Cardiovascular: Negative for chest pain.  Gastrointestinal: Negative for abdominal pain, diarrhea and vomiting.   Physical Exam Updated Vital Signs BP (!) 151/82 (BP Location: Right Arm)   Pulse 72   Temp 97.9 F (36.6 C) (Oral)   Resp  16   Ht 6' (1.829 m)   Wt 95.3 kg   SpO2 96%   BMI 28.48 kg/m   Physical Exam  Constitutional: He appears well-developed and well-nourished.  Non-toxic appearance. No distress.  HENT:  Head: Normocephalic and atraumatic.  Right Ear: No drainage, swelling or tenderness. Tympanic membrane is erythematous. Tympanic membrane is not perforated, not retracted and not bulging.  Left Ear: Tympanic membrane normal. No drainage, swelling or tenderness. Tympanic membrane is not perforated, not erythematous, not retracted and not bulging.  Nose: Mucosal edema present.  Mouth/Throat: Uvula is midline. Oropharyngeal exudate and posterior oropharyngeal erythema present.  Posterior oropharynx is symmetric appearing. Patient tolerating own secretions without difficulty. No trismus. No drooling. No hot potato voice. No swelling beneath the tongue, submandibular compartment is soft.  No mastoid erythema/warmth/swelling/tenderness bilaterally   Eyes: Conjunctivae are normal. Right eye exhibits no discharge. Left eye exhibits no discharge.  Neck: Normal range of motion. Neck supple. No neck rigidity. No edema and no erythema present.  Cardiovascular: Normal rate and regular rhythm.  Pulmonary/Chest: Effort normal and breath sounds normal. No respiratory distress. He has no wheezes. He has no rhonchi. He has no rales.  Respiration even and unlabored  Abdominal: Soft. He exhibits no distension. There is no tenderness.  Neurological: He is alert.  Clear speech.   Skin: Skin is warm and dry. No rash noted.  Psychiatric: He has a normal mood and affect. His behavior is normal.  Nursing note and vitals reviewed.    ED Treatments / Results  Labs (all labs ordered are listed, but only abnormal results are displayed) Labs Reviewed - No data to display  EKG None  Radiology Dg Chest 2 View  Result Date: 02/23/2018 CLINICAL DATA:  States he's been fighting a chest cold x few weeks, taking nyquil/dayquil.  Last 2-3 days has had Rt ear pain and productive cough. States pain shoots up into the RT ear when he swallows. Hypertension - current smoker EXAM: CHEST - 2 VIEW COMPARISON:  02/08/2015 FINDINGS: The heart size and mediastinal contours are within normal limits. Both lungs are clear. Degenerative changes are seen in thoracic spine. IMPRESSION: No active cardiopulmonary disease. Electronically Signed   By: Nolon Nations M.D.   On: 02/23/2018 21:06    Procedures Procedures (including critical care time)  Medications Ordered in ED Medications - No data to display   Initial Impression / Assessment and Plan / ED Course  I have reviewed the triage vital signs and the nursing notes.  Pertinent labs & imaging results that were available during my care of the patient were reviewed by me and considered in my medical decision making (see chart for details).    Patient presents with URI type symptoms.  Patient is nontoxic appearing, in no apparent distress, vitals are WNL other than  elevated BP, doubt HTN emergency. Patient is afebrile in the ED, lungs are CTA, CXR negative for infiltrate, doubt pneumonia. There is no wheezing or signs of respiratory distress. R ear with erythematous TM, no evidence of mastoiditis. No meningeal signs. Patient's posterior oropharynx is erythematous with exudates, however symmetric appearing w/ patent airway. No drooling. No trismus. No hot potato voice. Tolerating own secretions without difficulty. Full AROM of the neck. He does not appear to have findings of PTA/RPA on my assessment at this time. Given appearance of the throat feel and patient's hx feel that treatment for suppurative pharyngitis with clindamycin is reasonable. Will also provide naproxen for pain/swelling (last creatinine WNL) and flonase for congestion/rhinorrhea.  I discussed results, treatment plan, need for PCP follow-up, and strict return precautions with the patient. Provided opportunity for questions,  patient confirmed understanding and is in agreement with plan.    Final Clinical Impressions(s) / ED Diagnoses   Final diagnoses:  Suppurative pharyngitis    ED Discharge Orders         Ordered    clindamycin (CLEOCIN) 150 MG capsule  3 times daily     02/23/18 2150    naproxen (NAPROSYN) 500 MG tablet  2 times daily     02/23/18 2150    fluticasone (FLONASE) 50 MCG/ACT nasal spray  Daily     02/23/18 2150           Amaryllis Dyke, PA-C 02/23/18 2201    Quintella Reichert, MD 02/24/18 [phone]

## 2018-05-28 ENCOUNTER — Other Ambulatory Visit: Payer: Self-pay

## 2018-05-28 ENCOUNTER — Encounter: Payer: Self-pay | Admitting: Emergency Medicine

## 2018-05-28 ENCOUNTER — Emergency Department
Admission: EM | Admit: 2018-05-28 | Discharge: 2018-05-28 | Disposition: A | Discharge status: Home / Self Care | Payer: Self-pay | Attending: Emergency Medicine | Admitting: Emergency Medicine

## 2018-05-28 DIAGNOSIS — F17210 Nicotine dependence, cigarettes, uncomplicated: Secondary | ICD-10-CM | POA: Insufficient documentation

## 2018-05-28 DIAGNOSIS — I10 Essential (primary) hypertension: Secondary | ICD-10-CM | POA: Insufficient documentation

## 2018-05-28 DIAGNOSIS — Z79899 Other long term (current) drug therapy: Secondary | ICD-10-CM | POA: Insufficient documentation

## 2018-05-28 DIAGNOSIS — M62838 Other muscle spasm: Secondary | ICD-10-CM

## 2018-05-28 DIAGNOSIS — M62830 Muscle spasm of back: Secondary | ICD-10-CM | POA: Insufficient documentation

## 2018-05-28 MED ORDER — PREDNISONE 10 MG PO TABS: ORAL_TABLET | ORAL | 0 refills | Status: DC

## 2018-05-28 MED ORDER — CYCLOBENZAPRINE HCL 10 MG PO TABS: 10.0000 mg | ORAL_TABLET | Freq: Three times a day (TID) | ORAL | 0 refills | Status: DC | PRN

## 2018-05-28 MED ORDER — ORPHENADRINE CITRATE 30 MG/ML IJ SOLN
60.0000 mg | Freq: Two times a day (BID) | INTRAMUSCULAR | Status: DC
  Administered 2018-05-28: 60 mg via INTRAMUSCULAR
  Filled 2018-05-28: qty 2

## 2018-05-28 MED ORDER — KETOROLAC TROMETHAMINE 30 MG/ML IJ SOLN
30.0000 mg | Freq: Once | INTRAMUSCULAR | Status: AC
  Administered 2018-05-28: 30 mg via INTRAMUSCULAR
  Filled 2018-05-28: qty 1

## 2018-05-28 NOTE — ED Triage Notes (Signed)
C/O right lower back pain x1 week

## 2018-05-28 NOTE — ED Provider Notes (Signed)
[facility] Emergency Department Provider Note  ____________________________________________   First MD Initiated Contact with Patient 05/28/18 1541     (approximate)  I have reviewed the triage vital signs and the nursing notes.   HISTORY  Chief Complaint Back Pain    HPI Thomas [name] is a 53 y.o. 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  States he received an injection a year ago which helped.  On review of the chart it looks like he had a trigger point injection. Remainder ros neg   Past Medical History:  Diagnosis Date  . Bipolar 1 disorder ([place])   . Hypertension   . Hypoglycemia     Patient Active Problem List   Diagnosis Date Noted  . Peritonsillar abscess 05/22/2015  . Tonsil, abscess 05/22/2015    Past Surgical History:  Procedure Laterality Date  . HERNIA REPAIR    . INCISION AND DRAINAGE OF PERITONSILLAR ABCESS N/A 05/22/2015   Procedure: INCISION AND DRAINAGE OF PERITONSILLAR ABCESS;  Surgeon: Beverly Gust, MD;  Location: [facility] ORS;  Service: ENT;  Laterality: N/A;    Prior to Admission medications   Medication Sig Start Date End Date Taking? Authorizing Provider  amLODipine (NORVASC) 5 MG tablet Take 5 mg by mouth daily.   Yes [provider]  pravastatin (PRAVACHOL) 10 MG tablet Take 10 mg by mouth daily.   Yes [provider]  cyclobenzaprine (FLEXERIL) 10 MG tablet Take 1 tablet (10 mg total) by mouth 3 (three) times daily as needed. 05/28/18   , Linden Dolin, PA-C  predniSONE (DELTASONE) 10 MG tablet Take 6 pills on day 1 and decrease by 1 pill each day until all are gone.  Take these early in the  morning. 05/28/18   Caryn Section Linden Dolin, PA-C  venlafaxine XR (EFFEXOR-XR) 75 MG 24 hr capsule Take 1 capsule by mouth 2 (two) times daily. 08/20/14   [provider]    Allergies Sulfa antibiotics  Family History  Problem Relation Age of Onset  . Aneurysm Mother   . Heart disease Father     Social History Social History   Tobacco Use  . Smoking status: Current Every Day Smoker    Packs/day: 2.00    Types: Cigarettes  . Smokeless tobacco: Never Used  Substance Use Topics  . Alcohol use: No  . Drug use: No    Review of Systems  Constitutional: No fever/chills Eyes: No visual changes. ENT: No sore throat. Respiratory: Denies cough Genitourinary: Negative for dysuria. Musculoskeletal: Positive for back pain. Skin: Negative for rash.    ____________________________________________   PHYSICAL EXAM:  VITAL SIGNS: ED Triage Vitals  Enc Vitals Group     BP --      Pulse --      Resp --      Temp --      Temp src --      SpO2 --      Weight 05/28/18 1529 210 lb 1.6 oz (95.3 kg)     Height --      Head Circumference --      Peak Flow --      Pain Score 05/28/18 1528 10  Pain Loc --      Pain Edu? --      Excl. in [place]? --     Constitutional: Alert and oriented. Well appearing and in no acute distress. Eyes: Conjunctivae are normal.  Head: Atraumatic. Nose: No congestion/rhinnorhea. Mouth/Throat: Mucous membranes are moist.   Neck:  supple no lymphadenopathy noted Cardiovascular: Normal rate, regular rhythm. Heart sounds are normal Respiratory: Normal respiratory effort.  No retractions, lungs c t a  GU: deferred Musculoskeletal: FROM all extremities, warm and well perfused.  Decreased rom of back due to discomfort, lumbar spine nontender, positive for spasm noted in the thoracic paravertebrals and along the scapular levator, 5/5 strength in great toes b/l, 5/5 strength in lower legs, n/v intact Neurologic:  Normal speech and language.  Skin:  Skin is  warm, dry and intact. No rash noted. Psychiatric: Mood and affect are normal. Speech and behavior are normal.  ____________________________________________   LABS (all labs ordered are listed, but only abnormal results are displayed)  Labs Reviewed - No data to display ____________________________________________   ____________________________________________  RADIOLOGY    ____________________________________________   PROCEDURES  Procedure(s) performed: Toradol 30 mg IM and Norflex 60 mg IM  Procedures    ____________________________________________   INITIAL IMPRESSION / ASSESSMENT AND PLAN / ED COURSE  Pertinent labs & imaging results that were available during my care of the patient were reviewed by me and considered in my medical decision making (see chart for details).   Patient is 53 year old male presents emergency department complaining of muscle spasms in his upper back.  Physical exam shows a large spasm noted along the right side of the upper back.  Patient was given Toradol and Norflex while here in the ED.  Explained to him that a trigger point injection could possibly hit the lung and he would have a pneumothorax which would have to give him a chest tube.  He states he agrees that this is not a good idea at this time.  He was given a prescription for Sterapred and Flexeril.  He is to follow-up with his regular doctor or orthopedics if not better in 5 to 7 days.  Please apply wet heat followed by ice to the area.  Perform stretches.  Return if worsening.  States he understands will comply.  He was discharged in stable condition.     As part of my medical decision making, I reviewed the following data within the [place] History obtained from family, Nursing notes reviewed and incorporated, Old chart reviewed, Notes from prior ED visits and [place] Controlled Substance Database  ____________________________________________   FINAL CLINICAL  IMPRESSION(S) / ED DIAGNOSES  Final diagnoses:  Muscle spasm      NEW MEDICATIONS STARTED DURING THIS VISIT:  Discharge Medication List as of 05/28/2018  4:13 PM    START taking these medications   Details  cyclobenzaprine (FLEXERIL) 10 MG tablet Take 1 tablet (10 mg total) by mouth 3 (three) times daily as needed., Starting Mon 05/28/2018, Print    predniSONE (DELTASONE) 10 MG tablet Take 6 pills on day 1 and decrease by 1 pill each day until all are gone.  Take these early in the morning., Print         Note:  This document was prepared using Dragon voice recognition software and may include unintentional dictation errors.     Versie Starks, PA-C 05/28/18 2102    Rudene Re, MD 05/29/18 [phone]

## 2018-05-28 NOTE — ED Notes (Signed)
See triage note  Presents with pain to right mid back and moving into right leg  States pain started about 1 week ago  Ambulates with slight limp and unable to stand up straight d/t pain

## 2018-05-28 NOTE — Discharge Instructions (Addendum)
Follow-up with your regular doctor in acute care if not better in 5 to 7 days.  Return emergency department worsening.  Apply ice to the upper back to decrease inflammation after working.  Wet heat will help loosen the muscle but then apply ice afterwards.  Do not sit on a dry heating pad as this will make the muscle more brittle and cause more spasms.

## 2019-11-11 ENCOUNTER — Encounter: Payer: Self-pay | Admitting: Emergency Medicine

## 2019-11-11 ENCOUNTER — Emergency Department
Admission: EM | Admit: 2019-11-11 | Discharge: 2019-11-11 | Disposition: A | Discharge status: Home / Self Care | Payer: Self-pay | Attending: Emergency Medicine | Admitting: Emergency Medicine

## 2019-11-11 ENCOUNTER — Emergency Department: Payer: Self-pay

## 2019-11-11 ENCOUNTER — Other Ambulatory Visit: Payer: Self-pay

## 2019-11-11 DIAGNOSIS — Z79899 Other long term (current) drug therapy: Secondary | ICD-10-CM | POA: Insufficient documentation

## 2019-11-11 DIAGNOSIS — I10 Essential (primary) hypertension: Secondary | ICD-10-CM | POA: Insufficient documentation

## 2019-11-11 DIAGNOSIS — N201 Calculus of ureter: Secondary | ICD-10-CM | POA: Insufficient documentation

## 2019-11-11 DIAGNOSIS — F17210 Nicotine dependence, cigarettes, uncomplicated: Secondary | ICD-10-CM | POA: Insufficient documentation

## 2019-11-11 LAB — URINALYSIS, COMPLETE (UACMP) WITH MICROSCOPIC
Bacteria, UA: NONE SEEN
Bilirubin Urine: NEGATIVE
Glucose, UA: NEGATIVE mg/dL
Ketones, ur: NEGATIVE mg/dL
Nitrite: NEGATIVE
Protein, ur: 100 mg/dL — AB
RBC / HPF: >50 RBC/hpf — ABNORMAL HIGH (ref 0–5)
Specific Gravity, Urine: 1.028 (ref 1.005–1.030)
WBC, UA: >50 WBC/hpf — ABNORMAL HIGH (ref 0–5)
pH: 5 (ref 5.0–8.0)

## 2019-11-11 LAB — BASIC METABOLIC PANEL
Anion gap: 8 (ref 5–15)
BUN: 22 mg/dL — ABNORMAL HIGH (ref 6–20)
CO2: 24 mmol/L (ref 22–32)
Calcium: 9.2 mg/dL (ref 8.9–10.3)
Chloride: 109 mmol/L (ref 98–111)
Creatinine, Ser: 1.47 mg/dL — ABNORMAL HIGH (ref 0.61–1.24)
GFR calc Af Amer: >60 mL/min (ref >60)
GFR calc non Af Amer: 53 mL/min — ABNORMAL LOW (ref >60)
Glucose, Bld: 120 mg/dL — ABNORMAL HIGH (ref 70–99)
Potassium: 3.4 mmol/L — ABNORMAL LOW (ref 3.5–5.1)
Sodium: 141 mmol/L (ref 135–145)

## 2019-11-11 LAB — CBC
HCT: 46.1 % (ref 39.0–52.0)
Hemoglobin: 15.5 g/dL (ref 13.0–17.0)
MCH: 27.4 pg (ref 26.0–34.0)
MCHC: 33.6 g/dL (ref 30.0–36.0)
MCV: 81.4 fL (ref 80.0–100.0)
Platelets: 249 10*3/uL (ref 150–400)
RBC: 5.66 MIL/uL (ref 4.22–5.81)
RDW: 15 % (ref 11.5–15.5)
WBC: 13.5 10*3/uL — ABNORMAL HIGH (ref 4.0–10.5)
nRBC: 0 % (ref 0.0–0.2)

## 2019-11-11 MED ORDER — KETOROLAC TROMETHAMINE 30 MG/ML IJ SOLN
30.0000 mg | Freq: Once | INTRAMUSCULAR | Status: AC
  Administered 2019-11-11: 18:00:00 30 mg via INTRAVENOUS
  Filled 2019-11-11: qty 1

## 2019-11-11 MED ORDER — TAMSULOSIN HCL 0.4 MG PO CAPS: 0.4000 mg | ORAL_CAPSULE | Freq: Every day | ORAL | 0 refills | Status: DC

## 2019-11-11 MED ORDER — OXYCODONE-ACETAMINOPHEN 5-325 MG PO TABS: 1.0000 | ORAL_TABLET | ORAL | 0 refills | Status: DC | PRN

## 2019-11-11 MED ORDER — ONDANSETRON 4 MG PO TBDP: 4.0000 mg | ORAL_TABLET | Freq: Three times a day (TID) | ORAL | 0 refills | Status: DC | PRN

## 2019-11-11 MED ORDER — ONDANSETRON HCL 4 MG/2ML IJ SOLN
4.0000 mg | Freq: Once | INTRAMUSCULAR | Status: AC
  Administered 2019-11-11: 18:00:00 4 mg via INTRAVENOUS
  Filled 2019-11-11: qty 2

## 2019-11-11 MED ORDER — OXYCODONE-ACETAMINOPHEN 5-325 MG PO TABS
1.0000 | ORAL_TABLET | ORAL | Status: DC | PRN
  Administered 2019-11-11: 1 via ORAL
  Filled 2019-11-11: qty 1

## 2019-11-11 MED ORDER — SODIUM CHLORIDE 0.9 % IV SOLN
1000.0000 mL | Freq: Once | INTRAVENOUS | Status: AC
  Administered 2019-11-11: 18:00:00 1000 mL via INTRAVENOUS

## 2019-11-11 MED ORDER — NAPROXEN 500 MG PO TABS: 500.0000 mg | ORAL_TABLET | Freq: Two times a day (BID) | ORAL | 2 refills | Status: DC

## 2019-11-11 NOTE — ED Notes (Signed)
Patient transported to CT 

## 2019-11-11 NOTE — ED Triage Notes (Signed)
Here for left flank pain that started acutely yesterday. Went to [facility] hospital and waited 7 hrs and left before seen. No vomiting. No known hematuria.  Last urination was this AM.

## 2019-11-11 NOTE — ED Notes (Signed)
Unable to have pt sign d/c dt frozen computer

## 2019-11-11 NOTE — ED Triage Notes (Signed)
First Nurse Note:  C/O left flank pain x 1 day.  Patient states he has also had decreased urinary output despite increasing water intake.

## 2019-11-11 NOTE — ED Provider Notes (Signed)
[facility] Emergency Department Provider Note   ____________________________________________    I have reviewed the triage vital signs and the nursing notes.   HISTORY  Chief Complaint Flank Pain     HPI Thomas [name] is a 54 y.o. male with history as noted below who presents with complaints of left flank pain which started yesterday.  Patient reports the pain is moderate to severe in nature and fluctuates in intensity.  He is not take anything for this.  He went to an outside hospital yesterday but left after having to wait for many hours.  Denies fevers or chills.  No dysuria.  Positive nausea no vomiting.  No history of kidney stones in the past.  As noted dark urine   Past Medical History:  Diagnosis Date  . Bipolar 1 disorder ([place])   . Hypertension   . Hypoglycemia     Patient Active Problem List   Diagnosis Date Noted  . Peritonsillar abscess 05/22/2015  . Tonsil, abscess 05/22/2015    Past Surgical History:  Procedure Laterality Date  . HERNIA REPAIR    . INCISION AND DRAINAGE OF PERITONSILLAR ABCESS N/A 05/22/2015   Procedure: INCISION AND DRAINAGE OF PERITONSILLAR ABCESS;  Surgeon: Beverly Gust, MD;  Location: [facility] ORS;  Service: ENT;  Laterality: N/A;    Prior to Admission medications   Medication Sig Start Date End Date Taking? Authorizing Provider  amLODipine (NORVASC) 5 MG tablet Take 5 mg by mouth daily.    [provider]  cyclobenzaprine (FLEXERIL) 10 MG tablet Take 1 tablet (10 mg total) by mouth 3 (three) times daily as needed. 05/28/18   Fisher, Linden Dolin, PA-C  naproxen (NAPROSYN) 500 MG tablet Take 1 tablet (500 mg total) by mouth 2 (two) times daily with a meal. 11/11/19   Lavonia Drafts, MD  ondansetron (ZOFRAN ODT) 4 MG disintegrating tablet Take 1 tablet (4 mg total) by mouth every 8 (eight) hours as needed. 11/11/19   Lavonia Drafts, MD  oxyCODONE-acetaminophen (PERCOCET) 5-325 MG tablet Take 1 tablet by mouth  every 4 (four) hours as needed for severe pain. 11/11/19 11/10/20  Lavonia Drafts, MD  pravastatin (PRAVACHOL) 10 MG tablet Take 10 mg by mouth daily.    [provider]  predniSONE (DELTASONE) 10 MG tablet Take 6 pills on day 1 and decrease by 1 pill each day until all are gone.  Take these early in the morning. 05/28/18   Fisher, Linden Dolin, PA-C  tamsulosin (FLOMAX) 0.4 MG CAPS capsule Take 1 capsule (0.4 mg total) by mouth daily. 11/11/19   Lavonia Drafts, MD  venlafaxine XR (EFFEXOR-XR) 75 MG 24 hr capsule Take 1 capsule by mouth 2 (two) times daily. 08/20/14   [provider]     Allergies Sulfa antibiotics  Family History  Problem Relation Age of Onset  . Aneurysm Mother   . Heart disease Father     Social History Social History   Tobacco Use  . Smoking status: Current Every Day Smoker    Packs/day: 2.00    Types: Cigarettes  . Smokeless tobacco: Never Used  Substance Use Topics  . Alcohol use: No  . Drug use: No    Review of Systems  Constitutional: No fever/chills Eyes: No visual changes.  ENT: No sore throat. Cardiovascular: Denies chest pain. Respiratory: Denies shortness of breath. Gastrointestinal: As above Genitourinary: As above Musculoskeletal: Negative for back pain. Skin: Negative for rash. Neurological: Negative for headaches or weakness   ____________________________________________  PHYSICAL EXAM:  VITAL SIGNS: ED Triage Vitals [11/11/19 1553]  Enc Vitals Group     BP 137/79     Pulse Rate (!) 58     Resp 20     Temp 98.5 F (36.9 C)     Temp Source Oral     SpO2 98 %     Weight 102.5 kg (226 lb)     Height 1.829 m (6')     Head Circumference      Peak Flow      Pain Score 9     Pain Loc      Pain Edu?      Excl. in [place]?     Constitutional: Alert and oriented.   Nose: No congestion/rhinnorhea. Mouth/Throat: Mucous membranes are moist.    Cardiovascular: Normal rate, regular rhythm.  Good peripheral  circulation. Respiratory: Normal respiratory effort.  No retractions. Gastrointestinal: Soft and nontender. No distention.  No CVA tenderness.  Musculoskeletal: No lower extremity tenderness nor edema.  Warm and well perfused Neurologic:  Normal speech and language. No gross focal neurologic deficits are appreciated.  Skin:  Skin is warm, dry and intact. No rash noted. Psychiatric: Mood and affect are normal. Speech and behavior are normal.  ____________________________________________   LABS (all labs ordered are listed, but only abnormal results are displayed)  Labs Reviewed  URINALYSIS, COMPLETE (UACMP) WITH MICROSCOPIC - Abnormal; Notable for the following components:      Result Value   Color, Urine AMBER (*)    APPearance HAZY (*)    Hgb urine dipstick SMALL (*)    Protein, ur 100 (*)    Leukocytes,Ua MODERATE (*)    RBC / HPF >50 (*)    WBC, UA >50 (*)    All other components within normal limits  CBC - Abnormal; Notable for the following components:   WBC 13.5 (*)    All other components within normal limits  BASIC METABOLIC PANEL - Abnormal; Notable for the following components:   Potassium 3.4 (*)    Glucose, Bld 120 (*)    BUN 22 (*)    Creatinine, Ser 1.47 (*)    GFR calc non Af Amer 53 (*)    All other components within normal limits   ____________________________________________  EKG None ____________________________________________  RADIOLOGY  CT renal stone study ____________________________________________   PROCEDURES  Procedure(s) performed: No  Procedures   Critical Care performed: No ____________________________________________   INITIAL IMPRESSION / ASSESSMENT AND PLAN / ED COURSE  Pertinent labs & imaging results that were available during my care of the patient were reviewed by me and considered in my medical decision making (see chart for details).  Patient presents with left-sided flank pain abrupt onset suspicious for kidney  stone.  Differential also includes diverticulitis, urinary tract infection.  Lab work is notable for elevated white blood cell count of 13.5, mild elevation of BUN creatinine ratio consistent with mild dehydration.  Urinalysis demonstrates hemoglobin and white blood cells but no evidence of bacteria  We will treat with IV Toradol, IV Zofran, obtain CT renal stone study and reevaluate  CT scan demonstrates a 5 mm proximal stone, patient's pain totally resolved after Toradol.  Appropriate for discharge with close follow-up with urology, return precautions discussed    ____________________________________________   FINAL CLINICAL IMPRESSION(S) / ED DIAGNOSES  Final diagnoses:  Ureterolithiasis        Note:  This document was prepared using Dragon voice recognition software and may include unintentional dictation  errors.   Lavonia Drafts, MD 11/11/19 2053

## 2019-11-20 NOTE — Progress Notes (Signed)
11/21/2019 9:00 AM   Jerrye Beavers Kaufman [id number]  Referring provider: Leonel Ramsay, MD [place],  [place] [address] Chief Complaint  Patient presents with  . New Patient (Initial Visit)    HPI: Thomas [name] is a 54 y.o. male presents today to evaluate and manage ureterolithiasis.    -The patient was presented to the ED on 11/11/2019 complaining of left flank pain.  -Pain was moderate to severe in nature and fluctuated on intensity.  -He had nausea but no emesis. He notes dark urine.  -Denied taking any pain medications. Denied fevers or chills.  -UA showed small blood, protein 100, RBC >50, WBC >50. Patient was treated with IV Toradol and Zofran. Placed on Tamsulosin 0.4 ng daily. -CT renal stone study showed obstructing 5 mm proximal left ureteral calculus, with left-sided obstructive uropathy. Stable complex cyst lower pole left kidney, incompletely characterized on this unenhanced exam. -Punctate 3 mm nonobstructing right renal calculus. Cholelithiasis without cholecystitis.Hepatic steatosis. -No history of kidney stones.  -He continues to have left flank pain that radiates to LLQ.  -Denies nausea, fever, urgency and frequency.   PMH: Past Medical History:  Diagnosis Date  . Bipolar 1 disorder ([place])   . Hypertension   . Hypoglycemia     Surgical History: Past Surgical History:  Procedure Laterality Date  . HERNIA REPAIR    . INCISION AND DRAINAGE OF PERITONSILLAR ABCESS N/A 05/22/2015   Procedure: INCISION AND DRAINAGE OF PERITONSILLAR ABCESS;  Surgeon: Beverly Gust, MD;  Location: [facility] ORS;  Service: ENT;  Laterality: N/A;    Home Medications:  Allergies as of 11/21/2019      Reactions   Sulfa Antibiotics Hives      Medication List       Accurate as of November 21, 2019  9:00 AM. If you have any questions, ask your nurse or doctor.        amLODipine 5 MG tablet Commonly known as: NORVASC Take 5 mg by mouth daily.     cyclobenzaprine 10 MG tablet Commonly known as: FLEXERIL Take 1 tablet (10 mg total) by mouth 3 (three) times daily as needed.   naproxen 500 MG tablet Commonly known as: Naprosyn Take 1 tablet (500 mg total) by mouth 2 (two) times daily with a meal.   ondansetron 4 MG disintegrating tablet Commonly known as: Zofran ODT Take 1 tablet (4 mg total) by mouth every 8 (eight) hours as needed.   oxyCODONE-acetaminophen 5-325 MG tablet Commonly known as: Percocet Take 1 tablet by mouth every 4 (four) hours as needed for severe pain.   pravastatin 10 MG tablet Commonly known as: PRAVACHOL Take 10 mg by mouth daily.   predniSONE 10 MG tablet Commonly known as: DELTASONE Take 6 pills on day 1 and decrease by 1 pill each day until all are gone.  Take these early in the morning.   tamsulosin 0.4 MG Caps capsule Commonly known as: Flomax Take 1 capsule (0.4 mg total) by mouth daily.   venlafaxine XR 75 MG 24 hr capsule Commonly known as: EFFEXOR-XR Take 1 capsule by mouth 2 (two) times daily.       Allergies:  Allergies  Allergen Reactions  . Sulfa Antibiotics Hives    Family History: Family History  Problem Relation Age of Onset  . Aneurysm Mother   . Heart disease Father     Social History:  reports that he has been smoking cigarettes. He has been smoking about 2.00 packs per day.  He has never used smokeless tobacco. He reports that he does not drink alcohol and does not use drugs.   Physical Exam: BP (!) 144/85   Pulse 64   Ht 6' (1.829 m)   Wt 224 lb (101.6 kg)   BMI 30.38 kg/m   Constitutional:  Alert and oriented, No acute distress. HEENT: [place] AT, moist mucus membranes.  Trachea midline, no masses. Cardiovascular: No clubbing, cyanosis, or edema. RRR Respiratory: Normal respiratory effort, no increased work of breathing. Clear Skin: No rashes, bruises or suspicious lesions. Neurologic: Grossly intact, no focal deficits, moving all 4 extremities. Psychiatric:  Normal mood and affect.  Laboratory Data:  Lab Results  Component Value Date   CREATININE 1.47 (H) 11/11/2019    Pertinent Imaging:  CT Renal Stone Study  Narrative CLINICAL DATA:  Left flank pain for 1 day, decreased urine output  EXAM: CT ABDOMEN AND PELVIS WITHOUT CONTRAST  TECHNIQUE: Multidetector CT imaging of the abdomen and pelvis was performed following the standard protocol without IV contrast.  COMPARISON:  01/04/2016  FINDINGS: Lower chest: No acute pleural or parenchymal lung disease.  Hepatobiliary: Calcified gallstones are identified without cholecystitis. Heterogeneous decreased attenuation of the liver consistent with hepatic steatosis. Stable right lobe hepatic cyst.  Pancreas: Unremarkable. No pancreatic ductal dilatation or surrounding inflammatory changes.  Spleen: Normal in size without focal abnormality.  Adrenals/Urinary Tract: There is an obstructing 5 mm proximal left ureteral calculus reference image 44/2, with left-sided obstructive uropathy. Complex cyst lower pole left kidney containing mural nodularity and calcifications is not appreciably changed, measuring 7.3 x 7.3 cm. This is incompletely characterized on this unenhanced exam. Dedicated nonemergent outpatient renal MRI may be useful for further evaluation if not previously performed.  There is a punctate 3 mm nonobstructing right renal calculus. Bladder is decompressed but otherwise unremarkable. The adrenals are normal.  Stomach/Bowel: No bowel obstruction or ileus. Normal appendix right lower quadrant. No bowel wall thickening or inflammatory change.  Vascular/Lymphatic: No significant vascular findings are present. Stable 14 mm precaval lymph node, benign given long-term stability. No other pathologic adenopathy.  Reproductive: Prostate is unremarkable.  Other: No abdominal wall hernia or abnormality. No abdominopelvic ascites.  Musculoskeletal: No acute or destructive  bony lesions. Reconstructed images demonstrate no additional findings.  IMPRESSION: 1. Obstructing 5 mm proximal left ureteral calculus, with left-sided obstructive uropathy. 2. Stable complex cyst lower pole left kidney, incompletely characterized on this unenhanced exam. Dedicated nonemergent outpatient renal MRI may be useful for further evaluation if not previously performed. 3. Punctate 3 mm nonobstructing right renal calculus. 4. Cholelithiasis without cholecystitis. 5. Hepatic steatosis.   Electronically Signed By: Randa Ngo M.D. On: 11/11/2019 18:19   I have personally reviewed the images and agree with radiologist interpretation.    Assessment & Plan:    1. Ureterolithiasis, left -CT renal stone study on 11/11/19 showed obstructing 5 mm proximal left ureteral calculus, with left-sided obstructive uropathy.  -We discussed various treatment options for urolithiasis including observation with or without medical expulsive therapy, shockwave lithotripsy (SWL), ureteroscopy and laser lithotripsy with stent placement.  We discussed that management is based on stone size, location, density, patient co-morbidities, and patient preference.   Stones <71m in size have a >80% spontaneous passage rate. Data surrounding the use of tamsulosin for medical expulsive therapy is controversial, but meta analyses suggests it is most efficacious for distal stones between 5-129min size. Possible side effects include dizziness/lightheadedness, and retrograde ejaculation.  SWL has a lower stone free rate  in a single procedure, but also a lower complication rate compared to ureteroscopy and avoids a stent and associated stent related symptoms. Possible complications include renal hematoma, steinstrasse, and need for additional treatment.  Ureteroscopy with laser lithotripsy and stent placement has a higher stone free rate than SWL in a single procedure, however increased complication rate  including possible infection, ureteral injury, bleeding, and stent related morbidity. Common stent related symptoms include dysuria, urgency/frequency, and flank pain.  After an extensive discussion of the risks and benefits of the above treatment options, the patient would like to proceed with a KUB to assess for distal progression and possible continued MET. If treatment needed he desires shockwave lithotripsy  -KUB today, will call with results.  -Encouraged patient to continue tamsulosin 0.4 mg daily.  -Rx refill on pain medication and tamsulosin.   2. Complex left renal cyst -CT renal stone study on 11/11/19 showed stable complex cyst lower pole left kidney, incompletely characterized on this unenhanced exam.  -Once stone episode resolved will schedule renal mass protocol CT or MRI  -Patient agreed.   [place] [address] [place], [place] [address] [phone]  I, Selena Batten, am acting as a scribe for Dr. Nicki Reaper C. Conni Knighton,  I have reviewed the above documentation for accuracy and completeness, and I agree with the above.   Abbie Sons, MD

## 2019-11-21 ENCOUNTER — Ambulatory Visit
Admission: RE | Admit: 2019-11-21 | Discharge: 2019-11-21 | Disposition: A | Discharge status: Home / Self Care | Payer: Self-pay | Source: Ambulatory Visit | Attending: Urology | Admitting: Urology

## 2019-11-21 ENCOUNTER — Other Ambulatory Visit: Payer: Self-pay

## 2019-11-21 ENCOUNTER — Other Ambulatory Visit: Payer: Self-pay | Admitting: Family Medicine

## 2019-11-21 ENCOUNTER — Encounter: Payer: Self-pay | Admitting: Urology

## 2019-11-21 ENCOUNTER — Ambulatory Visit (INDEPENDENT_AMBULATORY_CARE_PROVIDER_SITE_OTHER): Payer: Self-pay | Admitting: Urology

## 2019-11-21 VITALS — BP 144/85 | HR 64 | Ht 72.0 in | Wt 224.0 lb

## 2019-11-21 DIAGNOSIS — N132 Hydronephrosis with renal and ureteral calculous obstruction: Secondary | ICD-10-CM

## 2019-11-21 DIAGNOSIS — N281 Cyst of kidney, acquired: Secondary | ICD-10-CM

## 2019-11-21 DIAGNOSIS — N200 Calculus of kidney: Secondary | ICD-10-CM | POA: Insufficient documentation

## 2019-11-21 DIAGNOSIS — N201 Calculus of ureter: Secondary | ICD-10-CM

## 2019-11-21 DIAGNOSIS — N23 Unspecified renal colic: Secondary | ICD-10-CM

## 2019-11-21 MED ORDER — TAMSULOSIN HCL 0.4 MG PO CAPS: 0.4000 mg | ORAL_CAPSULE | Freq: Every day | ORAL | 0 refills | Status: DC

## 2019-11-21 MED ORDER — OXYCODONE-ACETAMINOPHEN 5-325 MG PO TABS: 1.0000 | ORAL_TABLET | ORAL | 0 refills | Status: DC | PRN

## 2019-11-22 ENCOUNTER — Telehealth: Payer: Self-pay | Admitting: *Deleted

## 2019-11-22 NOTE — Telephone Encounter (Signed)
Left message for patient to call us back.  

## 2019-11-22 NOTE — Telephone Encounter (Signed)
-----   Message from Abbie Sons, MD sent at 11/22/2019  3:34 PM EDT ----- The stone is visualized on KUB and appears to be in the same location that it was at the time of his CT.  He was interested in shockwave lithotripsy and if he desires to schedule next week will give a scheduling sheet to Amy

## 2019-11-25 ENCOUNTER — Other Ambulatory Visit: Payer: Self-pay | Admitting: Urology

## 2019-11-25 NOTE — Telephone Encounter (Signed)
Notified patient as instructed,Amy to call patient

## 2019-11-26 ENCOUNTER — Telehealth: Payer: Self-pay

## 2019-11-26 NOTE — Telephone Encounter (Signed)
Pt left voicemail on triage line stating he was returning a call and needed to speak with Amy in regards to scheduling his litho.

## 2019-11-28 ENCOUNTER — Other Ambulatory Visit: Payer: Self-pay | Admitting: Radiology

## 2019-11-28 DIAGNOSIS — N201 Calculus of ureter: Secondary | ICD-10-CM

## 2019-12-03 ENCOUNTER — Telehealth: Payer: Self-pay | Admitting: Radiology

## 2019-12-03 ENCOUNTER — Other Ambulatory Visit: Payer: Self-pay

## 2019-12-03 ENCOUNTER — Other Ambulatory Visit: Admission: RE | Admit: 2019-12-03 | Payer: Self-pay | Source: Ambulatory Visit

## 2019-12-03 NOTE — Telephone Encounter (Signed)
Per Dr Bernardo Heater, notified patient that he may postpone surgery until after 01/01/2020. Patient states he will call back to schedule once insurance becomes active. Advised patient to call if needed before this time.

## 2019-12-03 NOTE — Telephone Encounter (Signed)
Patient is scheduled for lithotripsy on 12/05/2019. He has no insurance and can't pay for procedure. He is trying to get assistance. He asked if the procedure can be postponed until after 01/01/2020. Also he has been exposed to covid. Please advise.

## 2019-12-05 ENCOUNTER — Ambulatory Visit: Admission: RE | Admit: 2019-12-05 | Payer: Self-pay | Source: Home / Self Care | Admitting: Urology

## 2019-12-05 ENCOUNTER — Encounter: Admission: RE | Payer: Self-pay | Source: Home / Self Care

## 2019-12-05 SURGERY — LITHOTRIPSY, ESWL
Anesthesia: Moderate Sedation | Laterality: Left

## 2019-12-19 ENCOUNTER — Ambulatory Visit: Payer: Self-pay | Admitting: Physician Assistant

## 2020-03-09 ENCOUNTER — Telehealth: Payer: Self-pay

## 2020-03-09 DIAGNOSIS — Z87891 Personal history of nicotine dependence: Secondary | ICD-10-CM

## 2020-03-09 DIAGNOSIS — Z122 Encounter for screening for malignant neoplasm of respiratory organs: Secondary | ICD-10-CM

## 2020-03-09 NOTE — Telephone Encounter (Signed)
Contacted patient today for lung CT screening clinic based on referral Dr. Carrie Mew.  Patient agreeable and CT scheduled for Dec 14 at 1:30.  He will be texted this appt time, address to imaging center and phone number to [facility], lung navigator.  Patient is at work currently could not write down information.  Patient is a current smoker who started smoking at age 54.  He smokes at least 2 packs of cigarettes per day.  He does have insurance that he said he just acquired though [facility].  Group number is [id number] and ID number is [id number].

## 2020-03-10 NOTE — Addendum Note (Signed)
Addended by: Lieutenant Diego on: 03/10/2020 12:40 PM   Modules accepted: Orders

## 2020-03-10 NOTE — Telephone Encounter (Signed)
Current smoker, 82 pack year

## 2020-04-14 ENCOUNTER — Inpatient Hospital Stay: Payer: 59 | Attending: Nurse Practitioner | Admitting: Nurse Practitioner

## 2020-04-14 ENCOUNTER — Ambulatory Visit: Payer: 59

## 2020-04-14 ENCOUNTER — Ambulatory Visit
Admission: RE | Admit: 2020-04-14 | Discharge: 2020-04-14 | Disposition: A | Discharge status: Home / Self Care | Payer: 59 | Source: Ambulatory Visit | Attending: Nurse Practitioner | Admitting: Nurse Practitioner

## 2020-04-14 ENCOUNTER — Other Ambulatory Visit: Payer: Self-pay

## 2020-04-14 ENCOUNTER — Encounter: Payer: Self-pay | Admitting: Nurse Practitioner

## 2020-04-14 DIAGNOSIS — Z87891 Personal history of nicotine dependence: Secondary | ICD-10-CM

## 2020-04-14 DIAGNOSIS — F17210 Nicotine dependence, cigarettes, uncomplicated: Secondary | ICD-10-CM

## 2020-04-14 DIAGNOSIS — Z122 Encounter for screening for malignant neoplasm of respiratory organs: Secondary | ICD-10-CM | POA: Diagnosis present

## 2020-04-14 NOTE — Progress Notes (Signed)
Virtual Visit via Video Enabled Telemedicine Note   I connected with Pharmacist, hospital on 60/63/01 at 1:30 PM EST by video enabled telemedicine visit and verified that I am speaking with the correct person using two identifiers.   I discussed the limitations, risks, security and privacy concerns of performing an evaluation and management service by telemedicine and the availability of in-person appointments. I also discussed with the patient that there may be a patient responsible charge related to this service. The patient expressed understanding and agreed to proceed.   Other persons participating in the visit and their role in the encounter: Burgess Estelle, RN- checking in patient & navigation  Patient's location: [place]  Provider's location: Clinic  Chief Complaint: Low Dose CT Screening  Patient agreed to evaluation by telemedicine to discuss shared decision making for consideration of low dose CT lung cancer screening.    In accordance with CMS guidelines, patient has met eligibility criteria including age, absence of signs or symptoms of lung cancer.  Social History   Tobacco Use  . Smoking status: Current Every Day Smoker    Packs/day: 2.00    Years: 41.00    Pack years: 82.00    Types: Cigarettes  . Smokeless tobacco: Never Used  Substance Use Topics  . Alcohol use: No     A shared decision-making session was conducted prior to the performance of CT scan. This includes one or more decision aids, includes benefits and harms of screening, follow-up diagnostic testing, over-diagnosis, false positive rate, and total radiation exposure.   Counseling on the importance of adherence to annual lung cancer LDCT screening, impact of co-morbidities, and ability or willingness to undergo diagnosis and treatment is imperative for compliance of the program.   Counseling on the importance of continued smoking cessation for former smokers; the importance of smoking cessation for current  smokers, and information about tobacco cessation interventions have been given to patient including [place] and [employer] [place] programs.   Written order for lung cancer screening with LDCT has been given to the patient and any and all questions have been answered to the best of my abilities.    Yearly follow up will be coordinated by Burgess Estelle, Thoracic Navigator.  I discussed the assessment and treatment plan with the patient. The patient was provided an opportunity to ask questions and all were answered. The patient agreed with the plan and demonstrated an understanding of the instructions.   The patient was advised to call back or seek an in-person evaluation if the symptoms worsen or if the condition fails to improve as anticipated.   I provided 15 minutes of face-to-face video visit time during this encounter, and > 50% was spent counseling as documented under my assessment & plan.   Beckey Rutter, DNP, AGNP-C [place] [facility] [phone] (clinic)

## 2020-04-15 ENCOUNTER — Telehealth: Payer: Self-pay | Admitting: *Deleted

## 2020-04-15 NOTE — Telephone Encounter (Signed)
Notified patient of LDCT lung cancer screening program results with recommendation for 12 month follow up imaging. Also notified of incidental findings noted below and is encouraged to discuss further with PCP who will receive a copy of this note and/or the CT report. Patient verbalizes understanding.   IMPRESSION: Lung-RADS 2, benign appearance or behavior. Continue annual screening with low-dose chest CT without contrast in 12 months.  Emphysema (ICD10-J43.9).

## 2020-09-03 ENCOUNTER — Other Ambulatory Visit: Payer: Self-pay

## 2020-09-03 ENCOUNTER — Encounter: Payer: Self-pay | Admitting: Emergency Medicine

## 2020-09-03 ENCOUNTER — Emergency Department: Payer: 59

## 2020-09-03 ENCOUNTER — Emergency Department
Admission: EM | Admit: 2020-09-03 | Discharge: 2020-09-03 | Disposition: A | Discharge status: Home / Self Care | Payer: 59 | Attending: Emergency Medicine | Admitting: Emergency Medicine

## 2020-09-03 DIAGNOSIS — R109 Unspecified abdominal pain: Secondary | ICD-10-CM | POA: Diagnosis present

## 2020-09-03 DIAGNOSIS — I10 Essential (primary) hypertension: Secondary | ICD-10-CM | POA: Insufficient documentation

## 2020-09-03 DIAGNOSIS — N200 Calculus of kidney: Secondary | ICD-10-CM | POA: Insufficient documentation

## 2020-09-03 DIAGNOSIS — Z79899 Other long term (current) drug therapy: Secondary | ICD-10-CM | POA: Insufficient documentation

## 2020-09-03 DIAGNOSIS — F17210 Nicotine dependence, cigarettes, uncomplicated: Secondary | ICD-10-CM | POA: Diagnosis not present

## 2020-09-03 LAB — CBC WITH DIFFERENTIAL/PLATELET
Abs Immature Granulocytes: 0.07 10*3/uL (ref 0.00–0.07)
Basophils Absolute: 0.1 10*3/uL (ref 0.0–0.1)
Basophils Relative: 0 %
Eosinophils Absolute: 0.1 10*3/uL (ref 0.0–0.5)
Eosinophils Relative: 1 %
HCT: 43.2 % (ref 39.0–52.0)
Hemoglobin: 15 g/dL (ref 13.0–17.0)
Immature Granulocytes: 1 %
Lymphocytes Relative: 11 %
Lymphs Abs: 1.4 10*3/uL (ref 0.7–4.0)
MCH: 28.5 pg (ref 26.0–34.0)
MCHC: 34.7 g/dL (ref 30.0–36.0)
MCV: 82 fL (ref 80.0–100.0)
Monocytes Absolute: 0.9 10*3/uL (ref 0.1–1.0)
Monocytes Relative: 7 %
Neutro Abs: 10.6 10*3/uL — ABNORMAL HIGH (ref 1.7–7.7)
Neutrophils Relative %: 80 %
Platelets: 232 10*3/uL (ref 150–400)
RBC: 5.27 MIL/uL (ref 4.22–5.81)
RDW: 14.7 % (ref 11.5–15.5)
WBC: 13.1 10*3/uL — ABNORMAL HIGH (ref 4.0–10.5)
nRBC: 0 % (ref 0.0–0.2)

## 2020-09-03 LAB — URINALYSIS, COMPLETE (UACMP) WITH MICROSCOPIC
Bacteria, UA: NONE SEEN
Bilirubin Urine: NEGATIVE
Glucose, UA: NEGATIVE mg/dL
Ketones, ur: NEGATIVE mg/dL
Nitrite: NEGATIVE
Protein, ur: 30 mg/dL — AB
Specific Gravity, Urine: 1.025 (ref 1.005–1.030)
pH: 5 (ref 5.0–8.0)

## 2020-09-03 LAB — COMPREHENSIVE METABOLIC PANEL
ALT: 24 U/L (ref 0–44)
AST: 21 U/L (ref 15–41)
Albumin: 4.2 g/dL (ref 3.5–5.0)
Alkaline Phosphatase: 80 U/L (ref 38–126)
Anion gap: 9 (ref 5–15)
BUN: 21 mg/dL — ABNORMAL HIGH (ref 6–20)
CO2: 23 mmol/L (ref 22–32)
Calcium: 8.9 mg/dL (ref 8.9–10.3)
Chloride: 105 mmol/L (ref 98–111)
Creatinine, Ser: 1.68 mg/dL — ABNORMAL HIGH (ref 0.61–1.24)
GFR, Estimated: 47 mL/min — ABNORMAL LOW (ref >60)
Glucose, Bld: 148 mg/dL — ABNORMAL HIGH (ref 70–99)
Potassium: 4 mmol/L (ref 3.5–5.1)
Sodium: 137 mmol/L (ref 135–145)
Total Bilirubin: 1.3 mg/dL — ABNORMAL HIGH (ref 0.3–1.2)
Total Protein: 8 g/dL (ref 6.5–8.1)

## 2020-09-03 LAB — TROPONIN I (HIGH SENSITIVITY): Troponin I (High Sensitivity): 5 ng/L (ref <18)

## 2020-09-03 MED ORDER — TAMSULOSIN HCL 0.4 MG PO CAPS: 0.4000 mg | ORAL_CAPSULE | Freq: Every day | ORAL | 0 refills | Status: AC

## 2020-09-03 MED ORDER — LACTATED RINGERS IV BOLUS
1000.0000 mL | Freq: Once | INTRAVENOUS | Status: AC
  Administered 2020-09-03: 1000 mL via INTRAVENOUS

## 2020-09-03 MED ORDER — OXYCODONE HCL 5 MG PO TABS: 5.0000 mg | ORAL_TABLET | Freq: Four times a day (QID) | ORAL | 0 refills | Status: AC | PRN

## 2020-09-03 MED ORDER — OXYCODONE HCL 5 MG PO TABS
5.0000 mg | ORAL_TABLET | Freq: Once | ORAL | Status: AC
  Administered 2020-09-03: 5 mg via ORAL
  Filled 2020-09-03: qty 1

## 2020-09-03 MED ORDER — ONDANSETRON 4 MG PO TBDP: 4.0000 mg | ORAL_TABLET | Freq: Three times a day (TID) | ORAL | 0 refills | Status: DC | PRN

## 2020-09-03 MED ORDER — MORPHINE SULFATE (PF) 4 MG/ML IV SOLN
6.0000 mg | Freq: Once | INTRAVENOUS | Status: AC
  Administered 2020-09-03: 6 mg via INTRAVENOUS
  Filled 2020-09-03: qty 2

## 2020-09-03 MED ORDER — ONDANSETRON HCL 4 MG/2ML IJ SOLN
4.0000 mg | Freq: Once | INTRAMUSCULAR | Status: AC
  Administered 2020-09-03: 4 mg via INTRAVENOUS
  Filled 2020-09-03: qty 2

## 2020-09-03 NOTE — ED Provider Notes (Signed)
[facility] Emergency Department Provider Note  ____________________________________________   Event Date/Time   First MD Initiated Contact with Patient 09/03/20 [phone]     (approximate)  I have reviewed the triage vital signs    HISTORY  Chief Complaint Flank Pain    HPI Thomas [name] is a 55 y.o. male who presents with flank pain. Pt with bipolar, hypertension, kidney stones who comes in with concerns for left flank pain.  Patient reports intermittent pain for 3 days on the left side radiating to his abdomen and groin.  The pain is severe, constant, nothing makes it better, nothing makes it worse.  Associated with one episode of nausea and vomiting this morning.  Denies any urinary symptoms associated with it.  Patient reports that he had COVID back in January.  Denies any shortness of breath.  Did have a little bit of chest pain yesterday but he states it was just a few seconds and a twinge on the left side.  He did not feel like it was anything serious.     Past Medical History:  Diagnosis Date  . Bipolar 1 disorder ([place])   . Hypertension   . Hypoglycemia     Patient Active Problem List   Diagnosis Date Noted  . Peritonsillar abscess 05/22/2015  . Tonsil, abscess 05/22/2015    Past Surgical History:  Procedure Laterality Date  . HERNIA REPAIR    . INCISION AND DRAINAGE OF PERITONSILLAR ABCESS N/A 05/22/2015   Procedure: INCISION AND DRAINAGE OF PERITONSILLAR ABCESS;  Surgeon: Beverly Gust, MD;  Location: [facility] ORS;  Service: ENT;  Laterality: N/A;    Prior to Admission medications   Medication Sig Start Date End Date Taking? Authorizing Provider  amLODipine (NORVASC) 5 MG tablet Take 5 mg by mouth daily.    [provider]  cyclobenzaprine (FLEXERIL) 10 MG tablet Take 1 tablet (10 mg total) by mouth 3 (three) times daily as needed. 05/28/18   Fisher, Linden Dolin, PA-C  naproxen (NAPROSYN) 500 MG tablet Take 1 tablet (500 mg total) by  mouth 2 (two) times daily with a meal. 11/11/19   Lavonia Drafts, MD  ondansetron (ZOFRAN ODT) 4 MG disintegrating tablet Take 1 tablet (4 mg total) by mouth every 8 (eight) hours as needed. 11/11/19   Lavonia Drafts, MD  oxyCODONE-acetaminophen (PERCOCET) 5-325 MG tablet Take 1 tablet by mouth every 4 (four) hours as needed for severe pain. 11/21/19 11/20/20  Abbie Sons, MD  pravastatin (PRAVACHOL) 10 MG tablet Take 10 mg by mouth daily.    [provider]  predniSONE (DELTASONE) 10 MG tablet Take 6 pills on day 1 and decrease by 1 pill each day until all are gone.  Take these early in the morning. 05/28/18   Fisher, Linden Dolin, PA-C  tamsulosin (FLOMAX) 0.4 MG CAPS capsule Take 1 capsule (0.4 mg total) by mouth daily. 11/21/19   Stoioff, Ronda Fairly, MD  venlafaxine XR (EFFEXOR-XR) 75 MG 24 hr capsule Take 1 capsule by mouth 2 (two) times daily. 08/20/14   [provider]    Allergies Sulfa antibiotics  Family History  Problem Relation Age of Onset  . Aneurysm Mother   . Heart disease Father     Social History Social History   Tobacco Use  . Smoking status: Current Every Day Smoker    Packs/day: 2.00    Years: 41.00    Pack years: 82.00    Types: Cigarettes  . Smokeless tobacco: Never Used  Vaping  Use  . Vaping Use: Never used  Substance Use Topics  . Alcohol use: No  . Drug use: No      Review of Systems Constitutional: No fever/chills Eyes: No visual changes. ENT: No sore throat. Cardiovascular: Positive chest pain Respiratory: no SOB. No cough Gastrointestinal: No abdominal pain.  No nausea, no vomiting.  No diarrhea.  No constipation. Genitourinary: no severe blood in urine Musculoskeletal: + flank pain Skin: Negative for rash. Neurological: Negative for headaches, focal weakness or numbness. All other ROS negative ____________________________________________   PHYSICAL EXAM:  VITAL SIGNS: ED Triage Vitals  Enc Vitals Group     BP 09/03/20 0629  (!) 148/91     Pulse Rate 09/03/20 0629 (!) 58     Resp 09/03/20 0629 20     Temp 09/03/20 0629 97.7 F (36.5 C)     Temp Source 09/03/20 0629 Oral     SpO2 09/03/20 0629 97 %     Weight 09/03/20 0627 230 lb (104.3 kg)     Height 09/03/20 0627 6\' 1"  (1.854 m)     Head Circumference --      Peak Flow --      Pain Score 09/03/20 0627 10     Pain Loc --      Pain Edu? --      Excl. in [place]? --     Constitutional: Alert and oriented. Discomfort noted  Eyes: Conjunctivae are normal. EOMI. Head: Atraumatic. Nose: No congestion/rhinnorhea. Mouth/Throat: Mucous membranes are moist.   Neck: No stridor. Trachea Midline. FROM Cardiovascular: Normal rate, regular rhythm.  Good peripheral circulation. Respiratory: no audible stridor, work of breathing  Gastrointestinal: Soft and nontender. No distention.  Musculoskeletal: No lower extremity tenderness nor edema.  No joint effusions. Neurologic:  Normal speech and language. No gross focal neurologic deficits are appreciated.  Skin:  Skin is warm, dry and intact. No rash noted. Psychiatric: Mood and affect are normal. Speech and behavior are normal. GU: Deferred  Flank tenderness.  ____________________________________________   LABS (all labs ordered are listed, but only abnormal results are displayed)  Labs Reviewed  COMPREHENSIVE METABOLIC PANEL - Abnormal; Notable for the following components:      Result Value   Glucose, Bld 148 (*)    BUN 21 (*)    Creatinine, Ser 1.68 (*)    Total Bilirubin 1.3 (*)    GFR, Estimated 47 (*)    All other components within normal limits  CBC WITH DIFFERENTIAL/PLATELET - Abnormal; Notable for the following components:   WBC 13.1 (*)    Neutro Abs 10.6 (*)    All other components within normal limits  URINALYSIS, COMPLETE (UACMP) WITH MICROSCOPIC - Abnormal; Notable for the following components:   Color, Urine YELLOW (*)    APPearance HAZY (*)    Hgb urine dipstick SMALL (*)    Protein, ur 30  (*)    Leukocytes,Ua TRACE (*)    All other components within normal limits  TROPONIN I (HIGH SENSITIVITY)   ____________________________________________   ED ECG REPORT I, Vanessa Nome, the attending physician, personally viewed and interpreted this ECG.  EKG is normal sinus bradycardia rate of 56, no ST elevation, no T wave inversions, normal intervals ____________________________________________  RADIOLOGY   Official radiology report(s): CT Renal Stone Study  Result Date: 09/03/2020 CLINICAL DATA:  Left flank pain. History of nephrolithiasis. The pain radiates into the groin and is associated with nausea. EXAM: CT ABDOMEN AND PELVIS WITHOUT CONTRAST TECHNIQUE: Multidetector CT  imaging of the abdomen and pelvis was performed following the standard protocol without IV contrast. COMPARISON:  10/12/2019 FINDINGS: Lower chest: Unremarkable. Hepatobiliary: 2 gallstones in the gallbladder, the largest measuring 8 mm. No gallbladder wall thickening or pericholecystic fluid. Stable small liver cyst and mild diffuse low density of the liver. Pancreas: Unremarkable. No pancreatic ductal dilatation or surrounding inflammatory changes. Spleen: Normal in size without focal abnormality. Adrenals/Urinary Tract: Normal appearing adrenal glands. Moderate dilatation of the left renal collecting system and ureter to the level of a 4 mm left ureteral calculus at the level of the mid pelvis. This is not visible on the scout image. Associated periureteric soft tissue stranding. Stable complex left renal cyst measuring 7.4 cm in maximum diameter and containing a small amount of wall calcification. 2 mm mid right renal calculus. No bladder or right ureteral calculi. Stomach/Bowel: Cysts with minimal colonic diverticulosis without evidence of diverticulitis. Normal appearing appendix. Unremarkable stomach and small bowel. Vascular/Lymphatic: No significant vascular findings are present. No enlarged abdominal or pelvic  lymph nodes. Reproductive: Mildly enlarged prostate gland. Other: Small bilateral inguinal hernias containing fat. Tiny umbilical hernia containing fat. Small oval metallic foreign body in the left rectus abdominus muscle. Musculoskeletal: Lumbar and lower thoracic spine degenerative changes. IMPRESSION: 1. 4 mm left ureteral calculus at the level of the mid pelvis, causing moderate left hydronephrosis and hydroureter. 2. 2 mm nonobstructing right renal calculus. 3. Stable mild diffuse hepatic steatosis. 4. Stable cholelithiasis without evidence of cholecystitis. 5. Stable 7.4 cm complex left renal cyst. Again, this could be further evaluated with outpatient renal MRI if not previously performed. Electronically Signed   By: Claudie Revering M.D.   On: 09/03/2020 07:39    ____________________________________________   PROCEDURES  Procedure(s) performed (including Critical Care):  .1-3 Lead EKG Interpretation Performed by: Vanessa Dumfries, MD Authorized by: Vanessa Aniak, MD     Interpretation: abnormal     ECG rate:  50s    ECG rate assessment: bradycardic     Rhythm: sinus rhythm     Ectopy: none     Conduction: normal       ____________________________________________   INITIAL IMPRESSION / ASSESSMENT AND PLAN / ED COURSE  Thomas [name] was evaluated in Emergency Department on 09/03/2020 for the symptoms described in the history of present illness. He was evaluated in the context of the global COVID-19 pandemic, which necessitated consideration that the patient might be at risk for infection with the SARS-CoV-2 virus that causes COVID-19. Institutional protocols and algorithms that pertain to the evaluation of patients at risk for COVID-19 are in a state of rapid change based on information released by regulatory bodies including the CDC and federal and state organizations. These policies and algorithms were followed during the patient's care in the ED.     Pt presents with flank pain.   Suspect this is most likely kidney stone. Will get labs to evaluate for electrolyte abnormalities/AKI.  CT scan ordered to evaluate for kidney stone.  Also consider appendicitis vs SBO although less likely given description of pain.  Will get UA to evaluate for UTI/pyelo.  Patient did report some chest pain so will ekg/trop and keep on cardiac monitoring.  Patient's oxygen levels are slightly low 92 to 95% but patient states that he has emphysema and has long COVID.  Denies any worsening shortness of breath  CT confirms kidney stone.  Incidental findings discussed with patient and report given to patient  No evidence of infected stone.  No fevers.  Patient's pain is better with the morphine but starting to creep back up.  We will give a dose of oxycodone.   UA re-assuring few WBCs but nitrite negative.   Reviewed labs--No severe AKI.  Does have some baseline CKD  D/w pt rx with tylenol and oxycodone for breath though pain.  Understands not to drive or work while on oxycodone.  Given zofran/flomax and urology number for follow up.  Pt expressed understanding and felt comfortable with dc home.  Stress the importance of coming back if he develops fevers.       ____________________________________________   FINAL CLINICAL IMPRESSION(S) / ED DIAGNOSES   Final diagnoses:  Kidney stone      MEDICATIONS GIVEN DURING THIS VISIT:  Medications  lactated ringers bolus 1,000 mL (1,000 mLs Intravenous New Bag/Given 09/03/20 0659)  morphine 4 MG/ML injection 6 mg (6 mg Intravenous Given 09/03/20 0655)  ondansetron (ZOFRAN) injection 4 mg (4 mg Intravenous Given 09/03/20 0654)  oxyCODONE (Oxy IR/ROXICODONE) immediate release tablet 5 mg (5 mg Oral Given 09/03/20 0816)     ED Discharge Orders         Ordered    oxyCODONE (ROXICODONE) 5 MG immediate release tablet  Every 6 hours PRN        09/03/20 0853    tamsulosin (FLOMAX) 0.4 MG CAPS capsule  Daily        09/03/20 0853    ondansetron (ZOFRAN  ODT) 4 MG disintegrating tablet  Every 8 hours PRN        09/03/20 0853           Note:  This document was prepared using Dragon voice recognition software and may include unintentional dictation errors.    Vanessa Lago, MD 09/03/20 [phone]

## 2020-09-03 NOTE — ED Notes (Signed)
Pt to ct 

## 2020-09-03 NOTE — ED Triage Notes (Signed)
Pt to triage via w/c, appears uncomfortable; st x 3 days having left flank pain radiating around into abd and groin accomp by nausea; st hx of same with kidney stone

## 2020-09-03 NOTE — Discharge Instructions (Addendum)
You have a kidney stone. See report below.    Take tylenol 1g every 8 hours daily. Take oxycodone for breakthrough pain. Do not drive, work, or operate machinery while on this.  Take zofran to help with nausea. Take Flomax to help dilate uretha. Call urology number above to schedule outpatient appointment. Return to ED for fevers, unable to keep food down, or any other concerns.      IMPRESSION:  1. 4 mm left ureteral calculus at the level of the mid pelvis,  causing moderate left hydronephrosis and hydroureter.  2. 2 mm nonobstructing right renal calculus.  3. Stable mild diffuse hepatic steatosis.  4. Stable cholelithiasis without evidence of cholecystitis.  5. Stable 7.4 cm complex left renal cyst. Again, this could be  further evaluated with outpatient renal MRI if not previously  performed.

## 2020-09-08 ENCOUNTER — Other Ambulatory Visit: Payer: Self-pay

## 2020-09-08 ENCOUNTER — Encounter: Payer: Self-pay | Admitting: Physician Assistant

## 2020-09-08 ENCOUNTER — Telehealth: Payer: Self-pay

## 2020-09-08 ENCOUNTER — Ambulatory Visit
Admission: RE | Admit: 2020-09-08 | Discharge: 2020-09-08 | Disposition: A | Discharge status: Home / Self Care | Payer: 59 | Source: Ambulatory Visit | Attending: Physician Assistant | Admitting: Physician Assistant

## 2020-09-08 ENCOUNTER — Other Ambulatory Visit: Payer: Self-pay | Admitting: Physician Assistant

## 2020-09-08 ENCOUNTER — Ambulatory Visit (INDEPENDENT_AMBULATORY_CARE_PROVIDER_SITE_OTHER): Payer: Self-pay | Admitting: Physician Assistant

## 2020-09-08 VITALS — BP 148/78 | HR 64 | Ht 72.0 in | Wt 230.0 lb

## 2020-09-08 DIAGNOSIS — N201 Calculus of ureter: Secondary | ICD-10-CM | POA: Insufficient documentation

## 2020-09-08 DIAGNOSIS — N281 Cyst of kidney, acquired: Secondary | ICD-10-CM

## 2020-09-08 MED ORDER — KETOROLAC TROMETHAMINE 60 MG/2ML IM SOLN
60.0000 mg | Freq: Once | INTRAMUSCULAR | Status: AC
  Administered 2020-09-08: 15 mg via INTRAMUSCULAR

## 2020-09-08 MED ORDER — KETOROLAC TROMETHAMINE 15 MG/ML IJ SOLN: 15.0000 mg | Freq: Once | INTRAMUSCULAR | Status: DC

## 2020-09-08 NOTE — Telephone Encounter (Signed)
ERROR

## 2020-09-08 NOTE — Progress Notes (Signed)
09/08/2020 2:18 PM   Thomas [name] [id number]  CC: Chief Complaint  Patient presents with  . Nephrolithiasis    HPI: Thomas [name] is a 55 y.o. male who presents today for ED follow-up of a 4x8 mm mid left ureteral stone.  He was seen in clinic by Dr. Bernardo Heater on 11/21/2019 for management of the same left ureteral stone as well as evaluation of a complex left renal cyst.  Patient desired ESWL but elected to defer this in the absence of insurance coverage; renal mass protocol CT or MRI was recommended following definitive stone management.  His symptoms resolved thereafter and he was lost to follow-up. He has since become insured. Interval medical history notable for COVID diagnosis in January 2022 with long haul symptoms.  He presented to the ED 5 days ago with reports of 3 days of left flank pain radiating to the LLQ and nausea.  He was noted to have an AKI with creatinine 1.68, slightly elevated since he was first seen for this stone last year.  His white count was stably elevated at 13.1.  UA with mild pyuria without nitrites or bacteriuria.  Urine culture was not sent.  He was discharged on Flomax, Zofran, and oxycodone.  Today he reports continued left flank pain radiating to the groin. He has had nausea and vomiting controllable with Zofran. Tylenol has helped with pain control, but he remains rather uncomfortable and would like to proceed with stone management.  CT stone study 5 days ago reveals interval migration of the left ureteral stone from the proximal to mid left ureter. Stone density <1000HU, skin-to-stone distance ~17cm. His complex left renal cyst appears stable.  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 He took two doses of this medication but discontinued it once his stone became symptomatic. He is also taking doxycycline per his PCP for treatment of myalgias possibly associated  with a tick bite. He denies a history of GI bleeds.  In-office UA today positive for 1+ protein; urine microscopy with 6-10 WBCs/HPF.  PMH: Past Medical History:  Diagnosis Date  . Bipolar 1 disorder ([place])   . Hypertension   . Hypoglycemia     Surgical History: Past Surgical History:  Procedure Laterality Date  . HERNIA REPAIR    . INCISION AND DRAINAGE OF PERITONSILLAR ABCESS N/A 05/22/2015   Procedure: INCISION AND DRAINAGE OF PERITONSILLAR ABCESS;  Surgeon: Beverly Gust, MD;  Location: [facility] ORS;  Service: ENT;  Laterality: N/A;    Home Medications:  Allergies as of 09/08/2020      Reactions   Sulfa Antibiotics Hives      Medication List       Accurate as of Sep 08, 2020  2:18 PM. If you have any questions, ask your nurse or doctor.        amLODipine 5 MG tablet Commonly known as: NORVASC Take 5 mg by mouth daily.   cyclobenzaprine 10 MG tablet Commonly known as: FLEXERIL Take 1 tablet (10 mg total) by mouth 3 (three) times daily as needed.   doxycycline 100 MG capsule Commonly known as: VIBRAMYCIN Take 100 mg by mouth 2 (two) times daily.   meloxicam 7.5 MG tablet Commonly known as: MOBIC Take 7.5 mg by mouth daily.   naproxen 500 MG tablet Commonly known as: Naprosyn Take 1 tablet (500 mg total) by mouth 2 (two) times daily with a meal.   ondansetron 4 MG disintegrating  tablet Commonly known as: Zofran ODT Take 1 tablet (4 mg total) by mouth every 8 (eight) hours as needed for nausea or vomiting.   oxyCODONE 5 MG immediate release tablet Commonly known as: Roxicodone Take 1 tablet (5 mg total) by mouth every 6 (six) hours as needed for up to 7 days.   pravastatin 10 MG tablet Commonly known as: PRAVACHOL Take 10 mg by mouth daily.   predniSONE 10 MG tablet Commonly known as: DELTASONE Take 6 pills on day 1 and decrease by 1 pill each day until all are gone.  Take these early in the morning.   rosuvastatin 10 MG tablet Commonly known as:  CRESTOR Take 10 mg by mouth at bedtime.   tamsulosin 0.4 MG Caps capsule Commonly known as: FLOMAX Take 1 capsule (0.4 mg total) by mouth daily for 14 days.   venlafaxine XR 75 MG 24 hr capsule Commonly known as: EFFEXOR-XR Take 1 capsule by mouth 2 (two) times daily.       Allergies:  Allergies  Allergen Reactions  . Sulfa Antibiotics Hives    Family History: Family History  Problem Relation Age of Onset  . Aneurysm Mother   . Heart disease Father     Social History:   reports that he has been smoking cigarettes. He has a 82.00 pack-year smoking history. He has never used smokeless tobacco. He reports that he does not drink alcohol and does not use drugs.  Physical Exam: BP (!) 148/78   Pulse 64   Ht 6' (1.829 m)   Wt 230 lb (104.3 kg)   BMI 31.19 kg/m   Constitutional:  Alert and oriented, no acute distress, nontoxic appearing HEENT: [place], AT Cardiovascular: No clubbing, cyanosis, or edema Respiratory: Normal respiratory effort, no increased work of breathing GU: Left CVA tenderness Skin: No rashes, bruises or suspicious lesions Neurologic: Grossly intact, no focal deficits, moving all 4 extremities Psychiatric: Normal mood and affect  Laboratory Data: Lab Results  Component Value Date   CREATININE 1.68 (H) 09/03/2020   Results for orders placed or performed during the hospital encounter of 09/03/20  Comprehensive metabolic panel  Result Value Ref Range   Sodium 137 135 - 145 mmol/L   Potassium 4.0 3.5 - 5.1 mmol/L   Chloride 105 98 - 111 mmol/L   CO2 23 22 - 32 mmol/L   Glucose, Bld 148 (H) 70 - 99 mg/dL   BUN 21 (H) 6 - 20 mg/dL   Creatinine, Ser 1.68 (H) 0.61 - 1.24 mg/dL   Calcium 8.9 8.9 - 10.3 mg/dL   Total Protein 8.0 6.5 - 8.1 g/dL   Albumin 4.2 3.5 - 5.0 g/dL   AST 21 15 - 41 U/L   ALT 24 0 - 44 U/L   Alkaline Phosphatase 80 38 - 126 U/L   Total Bilirubin 1.3 (H) 0.3 - 1.2 mg/dL   GFR, Estimated 47 (L) >60 mL/min   Anion gap 9 5 - 15  CBC  with Differential/Platelet  Result Value Ref Range   WBC 13.1 (H) 4.0 - 10.5 K/uL   RBC 5.27 4.22 - 5.81 MIL/uL   Hemoglobin 15.0 13.0 - 17.0 g/dL   HCT 43.2 39.0 - 52.0 %   MCV 82.0 80.0 - 100.0 fL   MCH 28.5 26.0 - 34.0 pg   MCHC 34.7 30.0 - 36.0 g/dL   RDW 14.7 11.5 - 15.5 %   Platelets 232 150 - 400 K/uL   nRBC 0.0 0.0 - 0.2 %  Neutrophils Relative % 80 %   Neutro Abs 10.6 (H) 1.7 - 7.7 K/uL   Lymphocytes Relative 11 %   Lymphs Abs 1.4 0.7 - 4.0 K/uL   Monocytes Relative 7 %   Monocytes Absolute 0.9 0.1 - 1.0 K/uL   Eosinophils Relative 1 %   Eosinophils Absolute 0.1 0.0 - 0.5 K/uL   Basophils Relative 0 %   Basophils Absolute 0.1 0.0 - 0.1 K/uL   Immature Granulocytes 1 %   Abs Immature Granulocytes 0.07 0.00 - 0.07 K/uL  Urinalysis, Complete w Microscopic Urine, Clean Catch  Result Value Ref Range   Color, Urine YELLOW (A) YELLOW   APPearance HAZY (A) CLEAR   Specific Gravity, Urine 1.025 1.005 - 1.030   pH 5.0 5.0 - 8.0   Glucose, UA NEGATIVE NEGATIVE mg/dL   Hgb urine dipstick SMALL (A) NEGATIVE   Bilirubin Urine NEGATIVE NEGATIVE   Ketones, ur NEGATIVE NEGATIVE mg/dL   Protein, ur 30 (A) NEGATIVE mg/dL   Nitrite NEGATIVE NEGATIVE   Leukocytes,Ua TRACE (A) NEGATIVE   RBC / HPF 0-5 0 - 5 RBC/hpf   WBC, UA 11-20 0 - 5 WBC/hpf   Bacteria, UA NONE SEEN NONE SEEN   Squamous Epithelial / LPF 0-5 0 - 5   Mucus PRESENT   Troponin I (High Sensitivity)  Result Value Ref Range   Troponin I (High Sensitivity) 5 <18 ng/L   Pertinent Imaging: Results for orders placed during the hospital encounter of 09/03/20  CT Renal Stone Study  Narrative CLINICAL DATA:  Left flank pain. History of nephrolithiasis. The pain radiates into the groin and is associated with nausea.  EXAM: CT ABDOMEN AND PELVIS WITHOUT CONTRAST  TECHNIQUE: Multidetector CT imaging of the abdomen and pelvis was performed following the standard protocol without IV contrast.  COMPARISON:   10/12/2019  FINDINGS: Lower chest: Unremarkable.  Hepatobiliary: 2 gallstones in the gallbladder, the largest measuring 8 mm. No gallbladder wall thickening or pericholecystic fluid. Stable small liver cyst and mild diffuse low density of the liver.  Pancreas: Unremarkable. No pancreatic ductal dilatation or surrounding inflammatory changes.  Spleen: Normal in size without focal abnormality.  Adrenals/Urinary Tract: Normal appearing adrenal glands. Moderate dilatation of the left renal collecting system and ureter to the level of a 4 mm left ureteral calculus at the level of the mid pelvis. This is not visible on the scout image. Associated periureteric soft tissue stranding.  Stable complex left renal cyst measuring 7.4 cm in maximum diameter and containing a small amount of wall calcification.  2 mm mid right renal calculus. No bladder or right ureteral calculi.  Stomach/Bowel: Cysts with minimal colonic diverticulosis without evidence of diverticulitis. Normal appearing appendix. Unremarkable stomach and small bowel.  Vascular/Lymphatic: No significant vascular findings are present. No enlarged abdominal or pelvic lymph nodes.  Reproductive: Mildly enlarged prostate gland.  Other: Small bilateral inguinal hernias containing fat. Tiny umbilical hernia containing fat. Small oval metallic foreign body in the left rectus abdominus muscle.  Musculoskeletal: Lumbar and lower thoracic spine degenerative changes.  IMPRESSION: 1. 4 mm left ureteral calculus at the level of the mid pelvis, causing moderate left hydronephrosis and hydroureter. 2. 2 mm nonobstructing right renal calculus. 3. Stable mild diffuse hepatic steatosis. 4. Stable cholelithiasis without evidence of cholecystitis. 5. Stable 7.4 cm complex left renal cyst. Again, this could be further evaluated with outpatient renal MRI if not previously performed.   Electronically Signed By: Claudie Revering M.D. On:  09/03/2020 07:39  I  personally reviewed the images referenced above and note interval migration of the left ureteral stone to the mid ureter and a stable complex left renal cyst.  Assessment & Plan:   1. Left ureteral stone x1 year with slight distal migration in the interim. No signs of infection today. Administered 15mg  IM Toradol in office today for pain control and counseled patient to stay off meloxicam.  We again discussed stone management options today. Patient remains most interested in ESWL. With stone overlying the sacrum on recent CT, I ordered KUB to confirm visibility. If stone is visible, will proceed with ESWL this Thursday. Otherwise, I explained to the patient that he will no longer be a candidate for this procedure and I recommend ureteroscopy with laser lithotripsy and stent placement with Dr. Bernardo Heater next week.   We discussed that ESWL is a less invasive procedure than URS that carries a slightly lower stone clearance rate on first procedure. I explained that he will have to pass stone fragments following ESWL and this may be painful similar to a stone episode. He expressed understanding and wishes to proceed.  Lastly, we discussed return precautions today including fever, chills, or uncontrollable pain/nausea/vomiting. - Urinalysis, Complete - CULTURE, URINE COMPREHENSIVE - ketorolac (TORADOL) injection 60 mg  2. Complex renal cyst Stable on CT. Will recommend renal mass protocol CT or MRI following stone passage per Dr. Dene Gentry prior note.   Return for Tentative ESWL on Thursday.  Debroah Loop, PA-C  [facility] [address] [place], [place] [address] [phone]

## 2020-09-08 NOTE — Patient Instructions (Addendum)
Please go straight from our office today to the Imaging department for your abdominal X-ray. You may go home afterward and I will call you with your results.   Assuming I can see the stone on your X-ray, we will book you for a shockwave lithotripsy to treat your stone this Thursday. In the meantime, please do the following: -Continue Flomax 0.4mg  daily -Stay well hydrated -Strain your urine to catch any stones that pass -Treat any pain with Tylenol (acetaminophen) or oxycodone. Do NOT take naproxen or meloxicam (Mobic). -Treat any nausea with Zofran  Please call our office immediately (we are open 8a-5p Monday-Friday) or go to the Emergency Department if you develop any of the following: -Fever -Chills -Nausea and/or vomiting uncontrollable with Zofran -Pain uncontrollable with oxycodone  Lithotripsy  Lithotripsy is a treatment that can help break up kidney stones that are too large to pass on their own. This is a nonsurgical procedure that crushes a kidney stone with shock waves. These shock waves pass through your body and focus on the kidney stone. They cause the kidney stone to break up into smaller pieces while it is still in the urinary tract. The smaller pieces of stone can pass more easily out of your body in the urine. Tell a health care provider about:  Any allergies you have.  All medicines you are taking, including vitamins, herbs, eye drops, creams, and over-the-counter medicines.  Any problems you or family members have had with anesthetic medicines.  Any blood disorders you have.  Any surgeries you have had.  Any medical conditions you have.  Whether you are pregnant or may be pregnant. What are the risks? Generally, this is a safe procedure. However, problems may occur, including:  Infection.  Bleeding from the kidney.  Bruising of the kidney or skin.  Scarring of the kidney, which can lead to: ? Increased blood pressure. ? Poor kidney function. ? Return  (recurrence) of kidney stones.  Damage to other structures or organs, such as the liver, colon, spleen, or pancreas.  Blockage (obstruction) of the tube that carries urine from the kidney to the bladder (ureter).  Failure of the kidney stone to break into pieces (fragments). What happens before the procedure? Staying hydrated Follow instructions from your health care provider about hydration, which may include:  Up to 2 hours before the procedure - you may continue to drink clear liquids, such as water, clear fruit juice, black coffee, and plain tea. Eating and drinking restrictions Follow instructions from your health care provider about eating and drinking, which may include:  8 hours before the procedure - stop eating heavy meals or foods, such as meat, fried foods, or fatty foods.  6 hours before the procedure - stop eating light meals or foods, such as toast or cereal.  6 hours before the procedure - stop drinking milk or drinks that contain milk.  2 hours before the procedure - stop drinking clear liquids. Medicines Ask your health care provider about:  Changing or stopping your regular medicines. This is especially important if you are taking diabetes medicines or blood thinners.  Taking medicines such as aspirin and ibuprofen. These medicines can thin your blood. Do not take these medicines unless your health care provider tells you to take them.  Taking over-the-counter medicines, vitamins, herbs, and supplements. Tests You may have tests, such as:  Blood tests.  Urine tests.  Imaging tests, such as a CT scan. General instructions  Plan to have someone take you home  from the hospital or clinic.  If you will be going home right after the procedure, plan to have someone with you for 24 hours.  Ask your health care provider what steps will be taken to help prevent infection. These may include washing skin with a germ-killing soap. What happens during the  procedure?  An IV will be inserted into one of your veins.  You will be given one or more of the following: ? A medicine to help you relax (sedative). ? A medicine to make you fall asleep (general anesthetic).  A water-filled cushion may be placed behind your kidney or on your abdomen. In some cases, you may be placed in a tub of lukewarm water.  Your body will be positioned in a way that makes it easy to target the kidney stone.  An X-ray or ultrasound exam will be done to locate your stone.  Shock waves will be aimed at the stone. If you are awake, you may feel a tapping sensation as the shock waves pass through your body.  A flexible tube with holes in it (stent) may be placed in the ureter. This will help keep urine flowing from the kidney if the fragments of the stone have been blocking the ureter. The procedure may vary among health care providers and hospitals.   What happens after the procedure?  You may have an X-ray to see whether the procedure was able to break up the kidney stone and how much of the stone has passed. If large stone fragments remain after treatment, you may need to have a second procedure at a later time.  Your blood pressure, heart rate, breathing rate, and blood oxygen level will be monitored until you leave the hospital or clinic.  You may be given antibiotics or pain medicine as needed.  If a stent was placed in your ureter during surgery, it may stay in place for a few weeks.  You may need to strain your urine to collect pieces of the kidney stone for testing.  You will need to drink plenty of water.  If you were given a sedative during the procedure, it can affect you for several hours. Do not drive or operate machinery until your health care provider says that it is safe. Summary  Lithotripsy is a treatment that can help break up kidney stones that are too large to pass on their own.  Lithotripsy is a nonsurgical procedure that crushes a kidney  stone with shock waves.  Generally, this is a safe procedure. However, problems may occur, including damage to the kidney or other organs, infection, or obstruction of the tube that carries urine from the kidney to the bladder (ureter).  You may have a stent placed in your ureter to help drain your urine. This stent may stay in place for a few weeks.  After the procedure, you will need to drink plenty of water. You may be asked to strain your urine to collect pieces of the kidney stone for testing. This information is not intended to replace advice given to you by your health care provider. Make sure you discuss any questions you have with your health care provider. Document Revised: 01/30/2019 Document Reviewed: 01/30/2019 Elsevier Patient Education  [employer].

## 2020-09-08 NOTE — Progress Notes (Signed)
IM Injection  Patient is present today for an IM Injection for treatment of pain. Drug: Toradol Dose:15mg  Location:left deltoid Lot: [id number] Exp:10/2021 Patient tolerated well, no complications were noted  Performed by: Bradly Bienenstock, CMA

## 2020-09-08 NOTE — Telephone Encounter (Signed)
Called patient and notified him per Sam that his stone is visible on xray and he can proceed with ESWL for this Thursday. He will need to arrive @ 9am at [facility] and pre admit will call for a virtual pre-op apt. No covid test needed. Orders faxed and entered

## 2020-09-08 NOTE — H&P (View-Only) (Signed)
09/08/2020 2:18 PM   Thomas [name] [birth year] [id number]  CC: Chief Complaint  Patient presents with  . Nephrolithiasis    HPI: Thomas [name] is a 55 y.o. male who presents today for ED follow-up of a 4x8 mm mid left ureteral stone.  He was seen in clinic by Dr. Bernardo Heater on 11/21/2019 for management of the same left ureteral stone as well as evaluation of a complex left renal cyst.  Patient desired ESWL but elected to defer this in the absence of insurance coverage; renal mass protocol CT or MRI was recommended following definitive stone management.  His symptoms resolved thereafter and he was lost to follow-up. He has since become insured. Interval medical history notable for COVID diagnosis in January 2022 with long haul symptoms.  He presented to the ED 5 days ago with reports of 3 days of left flank pain radiating to the LLQ and nausea.  He was noted to have an AKI with creatinine 1.68, slightly elevated since he was first seen for this stone last year.  His white count was stably elevated at 13.1.  UA with mild pyuria without nitrites or bacteriuria.  Urine culture was not sent.  He was discharged on Flomax, Zofran, and oxycodone.  Today he reports continued left flank pain radiating to the groin. He has had nausea and vomiting controllable with Zofran. Tylenol has helped with pain control, but he remains rather uncomfortable and would like to proceed with stone management.  CT stone study 5 days ago reveals interval migration of the left ureteral stone from the proximal to mid left ureter. Stone density <1000HU, skin-to-stone distance ~17cm. His complex left renal cyst appears stable.  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 He took two doses of this medication but discontinued it once his stone became symptomatic. He is also taking doxycycline per his PCP for treatment of myalgias possibly associated  with a tick bite. He denies a history of GI bleeds.  In-office UA today positive for 1+ protein; urine microscopy with 6-10 WBCs/HPF.  PMH: Past Medical History:  Diagnosis Date  . Bipolar 1 disorder ([place])   . Hypertension   . Hypoglycemia     Surgical History: Past Surgical History:  Procedure Laterality Date  . HERNIA REPAIR    . INCISION AND DRAINAGE OF PERITONSILLAR ABCESS N/A 05/22/2015   Procedure: INCISION AND DRAINAGE OF PERITONSILLAR ABCESS;  Surgeon: Beverly Gust, MD;  Location: [facility] ORS;  Service: ENT;  Laterality: N/A;    Home Medications:  Allergies as of 09/08/2020      Reactions   Sulfa Antibiotics Hives      Medication List       Accurate as of Sep 08, 2020  2:18 PM. If you have any questions, ask your nurse or doctor.        amLODipine 5 MG tablet Commonly known as: NORVASC Take 5 mg by mouth daily.   cyclobenzaprine 10 MG tablet Commonly known as: FLEXERIL Take 1 tablet (10 mg total) by mouth 3 (three) times daily as needed.   doxycycline 100 MG capsule Commonly known as: VIBRAMYCIN Take 100 mg by mouth 2 (two) times daily.   meloxicam 7.5 MG tablet Commonly known as: MOBIC Take 7.5 mg by mouth daily.   naproxen 500 MG tablet Commonly known as: Naprosyn Take 1 tablet (500 mg total) by mouth 2 (two) times daily with a meal.   ondansetron 4 MG disintegrating  tablet Commonly known as: Zofran ODT Take 1 tablet (4 mg total) by mouth every 8 (eight) hours as needed for nausea or vomiting.   oxyCODONE 5 MG immediate release tablet Commonly known as: Roxicodone Take 1 tablet (5 mg total) by mouth every 6 (six) hours as needed for up to 7 days.   pravastatin 10 MG tablet Commonly known as: PRAVACHOL Take 10 mg by mouth daily.   predniSONE 10 MG tablet Commonly known as: DELTASONE Take 6 pills on day 1 and decrease by 1 pill each day until all are gone.  Take these early in the morning.   rosuvastatin 10 MG tablet Commonly known as:  CRESTOR Take 10 mg by mouth at bedtime.   tamsulosin 0.4 MG Caps capsule Commonly known as: FLOMAX Take 1 capsule (0.4 mg total) by mouth daily for 14 days.   venlafaxine XR 75 MG 24 hr capsule Commonly known as: EFFEXOR-XR Take 1 capsule by mouth 2 (two) times daily.       Allergies:  Allergies  Allergen Reactions  . Sulfa Antibiotics Hives    Family History: Family History  Problem Relation Age of Onset  . Aneurysm Mother   . Heart disease Father     Social History:   reports that he has been smoking cigarettes. He has a 82.00 pack-year smoking history. He has never used smokeless tobacco. He reports that he does not drink alcohol and does not use drugs.  Physical Exam: BP (!) 148/78   Pulse 64   Ht 6' (1.829 m)   Wt 230 lb (104.3 kg)   BMI 31.19 kg/m   Constitutional:  Alert and oriented, no acute distress, nontoxic appearing HEENT: [place], AT Cardiovascular: No clubbing, cyanosis, or edema Respiratory: Normal respiratory effort, no increased work of breathing GU: Left CVA tenderness Skin: No rashes, bruises or suspicious lesions Neurologic: Grossly intact, no focal deficits, moving all 4 extremities Psychiatric: Normal mood and affect  Laboratory Data: Lab Results  Component Value Date   CREATININE 1.68 (H) 09/03/2020   Results for orders placed or performed during the hospital encounter of 09/03/20  Comprehensive metabolic panel  Result Value Ref Range   Sodium 137 135 - 145 mmol/L   Potassium 4.0 3.5 - 5.1 mmol/L   Chloride 105 98 - 111 mmol/L   CO2 23 22 - 32 mmol/L   Glucose, Bld 148 (H) 70 - 99 mg/dL   BUN 21 (H) 6 - 20 mg/dL   Creatinine, Ser 1.68 (H) 0.61 - 1.24 mg/dL   Calcium 8.9 8.9 - 10.3 mg/dL   Total Protein 8.0 6.5 - 8.1 g/dL   Albumin 4.2 3.5 - 5.0 g/dL   AST 21 15 - 41 U/L   ALT 24 0 - 44 U/L   Alkaline Phosphatase 80 38 - 126 U/L   Total Bilirubin 1.3 (H) 0.3 - 1.2 mg/dL   GFR, Estimated 47 (L) >60 mL/min   Anion gap 9 5 - 15  CBC  with Differential/Platelet  Result Value Ref Range   WBC 13.1 (H) 4.0 - 10.5 K/uL   RBC 5.27 4.22 - 5.81 MIL/uL   Hemoglobin 15.0 13.0 - 17.0 g/dL   HCT 43.2 39.0 - 52.0 %   MCV 82.0 80.0 - 100.0 fL   MCH 28.5 26.0 - 34.0 pg   MCHC 34.7 30.0 - 36.0 g/dL   RDW 14.7 11.5 - 15.5 %   Platelets 232 150 - 400 K/uL   nRBC 0.0 0.0 - 0.2 %  Neutrophils Relative % 80 %   Neutro Abs 10.6 (H) 1.7 - 7.7 K/uL   Lymphocytes Relative 11 %   Lymphs Abs 1.4 0.7 - 4.0 K/uL   Monocytes Relative 7 %   Monocytes Absolute 0.9 0.1 - 1.0 K/uL   Eosinophils Relative 1 %   Eosinophils Absolute 0.1 0.0 - 0.5 K/uL   Basophils Relative 0 %   Basophils Absolute 0.1 0.0 - 0.1 K/uL   Immature Granulocytes 1 %   Abs Immature Granulocytes 0.07 0.00 - 0.07 K/uL  Urinalysis, Complete w Microscopic Urine, Clean Catch  Result Value Ref Range   Color, Urine YELLOW (A) YELLOW   APPearance HAZY (A) CLEAR   Specific Gravity, Urine 1.025 1.005 - 1.030   pH 5.0 5.0 - 8.0   Glucose, UA NEGATIVE NEGATIVE mg/dL   Hgb urine dipstick SMALL (A) NEGATIVE   Bilirubin Urine NEGATIVE NEGATIVE   Ketones, ur NEGATIVE NEGATIVE mg/dL   Protein, ur 30 (A) NEGATIVE mg/dL   Nitrite NEGATIVE NEGATIVE   Leukocytes,Ua TRACE (A) NEGATIVE   RBC / HPF 0-5 0 - 5 RBC/hpf   WBC, UA 11-20 0 - 5 WBC/hpf   Bacteria, UA NONE SEEN NONE SEEN   Squamous Epithelial / LPF 0-5 0 - 5   Mucus PRESENT   Troponin I (High Sensitivity)  Result Value Ref Range   Troponin I (High Sensitivity) 5 <18 ng/L   Pertinent Imaging: Results for orders placed during the hospital encounter of 09/03/20  CT Renal Stone Study  Narrative CLINICAL DATA:  Left flank pain. History of nephrolithiasis. The pain radiates into the groin and is associated with nausea.  EXAM: CT ABDOMEN AND PELVIS WITHOUT CONTRAST  TECHNIQUE: Multidetector CT imaging of the abdomen and pelvis was performed following the standard protocol without IV contrast.  COMPARISON:   10/12/2019  FINDINGS: Lower chest: Unremarkable.  Hepatobiliary: 2 gallstones in the gallbladder, the largest measuring 8 mm. No gallbladder wall thickening or pericholecystic fluid. Stable small liver cyst and mild diffuse low density of the liver.  Pancreas: Unremarkable. No pancreatic ductal dilatation or surrounding inflammatory changes.  Spleen: Normal in size without focal abnormality.  Adrenals/Urinary Tract: Normal appearing adrenal glands. Moderate dilatation of the left renal collecting system and ureter to the level of a 4 mm left ureteral calculus at the level of the mid pelvis. This is not visible on the scout image. Associated periureteric soft tissue stranding.  Stable complex left renal cyst measuring 7.4 cm in maximum diameter and containing a small amount of wall calcification.  2 mm mid right renal calculus. No bladder or right ureteral calculi.  Stomach/Bowel: Cysts with minimal colonic diverticulosis without evidence of diverticulitis. Normal appearing appendix. Unremarkable stomach and small bowel.  Vascular/Lymphatic: No significant vascular findings are present. No enlarged abdominal or pelvic lymph nodes.  Reproductive: Mildly enlarged prostate gland.  Other: Small bilateral inguinal hernias containing fat. Tiny umbilical hernia containing fat. Small oval metallic foreign body in the left rectus abdominus muscle.  Musculoskeletal: Lumbar and lower thoracic spine degenerative changes.  IMPRESSION: 1. 4 mm left ureteral calculus at the level of the mid pelvis, causing moderate left hydronephrosis and hydroureter. 2. 2 mm nonobstructing right renal calculus. 3. Stable mild diffuse hepatic steatosis. 4. Stable cholelithiasis without evidence of cholecystitis. 5. Stable 7.4 cm complex left renal cyst. Again, this could be further evaluated with outpatient renal MRI if not previously performed.   Electronically Signed By: Claudie Revering M.D. On:  09/03/2020 07:39  I  personally reviewed the images referenced above and note interval migration of the left ureteral stone to the mid ureter and a stable complex left renal cyst.  Assessment & Plan:   1. Left ureteral stone x1 year with slight distal migration in the interim. No signs of infection today. Administered 15mg  IM Toradol in office today for pain control and counseled patient to stay off meloxicam.  We again discussed stone management options today. Patient remains most interested in ESWL. With stone overlying the sacrum on recent CT, I ordered KUB to confirm visibility. If stone is visible, will proceed with ESWL this Thursday. Otherwise, I explained to the patient that he will no longer be a candidate for this procedure and I recommend ureteroscopy with laser lithotripsy and stent placement with Dr. Bernardo Heater next week.   We discussed that ESWL is a less invasive procedure than URS that carries a slightly lower stone clearance rate on first procedure. I explained that he will have to pass stone fragments following ESWL and this may be painful similar to a stone episode. He expressed understanding and wishes to proceed.  Lastly, we discussed return precautions today including fever, chills, or uncontrollable pain/nausea/vomiting. - Urinalysis, Complete - CULTURE, URINE COMPREHENSIVE - ketorolac (TORADOL) injection 60 mg  2. Complex renal cyst Stable on CT. Will recommend renal mass protocol CT or MRI following stone passage per Dr. Dene Gentry prior note.   Return for Tentative ESWL on Thursday.  Debroah Loop, PA-C  [facility] [address] [place], [place] [address] [phone]

## 2020-09-09 LAB — URINALYSIS, COMPLETE
Bilirubin, UA: NEGATIVE
Glucose, UA: NEGATIVE
Ketones, UA: NEGATIVE
Leukocytes,UA: NEGATIVE
Nitrite, UA: NEGATIVE
RBC, UA: NEGATIVE
Specific Gravity, UA: 1.025 (ref 1.005–1.030)
Urobilinogen, Ur: 0.2 mg/dL (ref 0.2–1.0)
pH, UA: 5.5 (ref 5.0–7.5)

## 2020-09-09 LAB — MICROSCOPIC EXAMINATION
Bacteria, UA: NONE SEEN
RBC, Urine: NONE SEEN /hpf (ref 0–2)

## 2020-09-09 MED ORDER — ONDANSETRON HCL 4 MG/2ML IJ SOLN
4.0000 mg | Freq: Once | INTRAMUSCULAR | Status: AC
Start: 2020-09-10 — End: 2020-09-10

## 2020-09-10 ENCOUNTER — Encounter
Admission: RE | Disposition: A | Discharge status: Home / Self Care | Payer: Self-pay | Source: Home / Self Care | Attending: Urology

## 2020-09-10 ENCOUNTER — Other Ambulatory Visit: Payer: Self-pay

## 2020-09-10 ENCOUNTER — Ambulatory Visit
Admission: RE | Admit: 2020-09-10 | Discharge: 2020-09-10 | Disposition: A | Discharge status: Home / Self Care | Payer: 59 | Attending: Urology | Admitting: Urology

## 2020-09-10 ENCOUNTER — Encounter: Payer: Self-pay | Admitting: Urology

## 2020-09-10 ENCOUNTER — Ambulatory Visit: Payer: 59

## 2020-09-10 DIAGNOSIS — Z882 Allergy status to sulfonamides status: Secondary | ICD-10-CM | POA: Diagnosis not present

## 2020-09-10 DIAGNOSIS — F17210 Nicotine dependence, cigarettes, uncomplicated: Secondary | ICD-10-CM | POA: Diagnosis not present

## 2020-09-10 DIAGNOSIS — U099 Post covid-19 condition, unspecified: Secondary | ICD-10-CM | POA: Diagnosis not present

## 2020-09-10 DIAGNOSIS — N132 Hydronephrosis with renal and ureteral calculous obstruction: Secondary | ICD-10-CM | POA: Diagnosis not present

## 2020-09-10 DIAGNOSIS — Z8249 Family history of ischemic heart disease and other diseases of the circulatory system: Secondary | ICD-10-CM | POA: Insufficient documentation

## 2020-09-10 DIAGNOSIS — N201 Calculus of ureter: Secondary | ICD-10-CM | POA: Diagnosis present

## 2020-09-10 DIAGNOSIS — I10 Essential (primary) hypertension: Secondary | ICD-10-CM | POA: Diagnosis not present

## 2020-09-10 HISTORY — PX: EXTRACORPOREAL SHOCK WAVE LITHOTRIPSY: SHX1557

## 2020-09-10 SURGERY — LITHOTRIPSY, ESWL
Anesthesia: Choice | Laterality: Left

## 2020-09-10 MED ORDER — ONDANSETRON HCL 4 MG/2ML IJ SOLN
INTRAMUSCULAR | Status: AC
  Administered 2020-09-10: 4 mg via INTRAVENOUS
  Filled 2020-09-10: qty 2

## 2020-09-10 MED ORDER — CEPHALEXIN 500 MG PO CAPS: 500.0000 mg | ORAL_CAPSULE | Freq: Once | ORAL | 0 refills | Status: DC

## 2020-09-10 MED ORDER — CEPHALEXIN 500 MG PO CAPS
ORAL_CAPSULE | ORAL | Status: AC
  Administered 2020-09-10: 500 mg via ORAL
  Filled 2020-09-10: qty 1

## 2020-09-10 MED ORDER — DIPHENHYDRAMINE HCL 25 MG PO CAPS: 25.0000 mg | ORAL_CAPSULE | ORAL | Status: AC

## 2020-09-10 MED ORDER — DIAZEPAM 5 MG PO TABS
ORAL_TABLET | ORAL | Status: AC
  Administered 2020-09-10: 10 mg via ORAL
  Filled 2020-09-10: qty 2

## 2020-09-10 MED ORDER — DIAZEPAM 5 MG PO TABS: 10.0000 mg | ORAL_TABLET | ORAL | Status: AC

## 2020-09-10 MED ORDER — SODIUM CHLORIDE 0.9 % IV SOLN: INTRAVENOUS | Status: DC

## 2020-09-10 MED ORDER — CEPHALEXIN 500 MG PO CAPS: 500.0000 mg | ORAL_CAPSULE | Freq: Once | ORAL | Status: AC

## 2020-09-10 MED ORDER — DIPHENHYDRAMINE HCL 25 MG PO CAPS
ORAL_CAPSULE | ORAL | Status: AC
  Administered 2020-09-10: 25 mg via ORAL
  Filled 2020-09-10: qty 1

## 2020-09-10 NOTE — Brief Op Note (Signed)
09/10/2020  11:49 AM  PATIENT:  Thomas [name]  55 y.o. male  PRE-OPERATIVE DIAGNOSIS:  26mm left distal ureteral stone  POST-OPERATIVE DIAGNOSIS:  Same  PROCEDURE:  Procedure(s): EXTRACORPOREAL SHOCK WAVE LITHOTRIPSY (ESWL) (Left)  SURGEON:  Surgeon(s) and Role:    * Billey Co, MD - Primary  ANESTHESIA: Conscious Sedation  EBL:  None  Drains: None  Specimen: None  Findings:  1. Stone appeared to smudge well at conclusion of case  DISPO: Flomax, pain meds PRN, RTC 2 weeks KUB  Nickolas Madrid, MD 09/10/2020

## 2020-09-10 NOTE — Interval H&P Note (Signed)
UROLOGY H&P UPDATE  Agree with prior H&P dated 09/08/20 by Juliann Pares, PA. 22mm left distal ureteral stone, ongoing renal colic.  Cardiac: RRR Lungs: CTA bilaterally  Laterality: Left Procedure: Shockwave lithotripsy  Urine: UA 6-10 WBCs, no bacteria, nitrite negative , no leukocytes  Informed consent obtained, we specifically discussed the risks of bleeding/hematoma, infection, post-operative pain/obstructive fragments, need for additional procedures.  Billey Co, MD 09/10/2020

## 2020-09-10 NOTE — Discharge Instructions (Signed)
AMBULATORY SURGERY  DISCHARGE INSTRUCTIONS   1) The drugs that you were given will stay in your system until tomorrow so for the next 24 hours you should not:  A) Drive an automobile B) Make any legal decisions C) Drink any alcoholic beverage   2) You may resume regular meals tomorrow.  Today it is better to start with liquids and gradually work up to solid foods.  You may eat anything you prefer, but it is better to start with liquids, then soup and crackers, and gradually work up to solid foods.   3) Please notify your doctor immediately if you have any unusual bleeding, trouble breathing, redness and pain at the surgery site, drainage, fever, or pain not relieved by medication.    4) Additional Instructions:  Follow [facility] instructions      Please contact your physician with any problems or Same Day Surgery at [phone], Monday through Friday 6 am to 4 pm, or [place] at [facility] number at [phone].

## 2020-09-14 LAB — CULTURE, URINE COMPREHENSIVE

## 2020-09-24 ENCOUNTER — Ambulatory Visit
Admission: RE | Admit: 2020-09-24 | Discharge: 2020-09-24 | Disposition: A | Discharge status: Home / Self Care | Payer: 59 | Source: Ambulatory Visit | Attending: Physician Assistant | Admitting: Physician Assistant

## 2020-09-24 ENCOUNTER — Other Ambulatory Visit: Payer: Self-pay

## 2020-09-24 ENCOUNTER — Ambulatory Visit (INDEPENDENT_AMBULATORY_CARE_PROVIDER_SITE_OTHER): Payer: 59 | Admitting: Physician Assistant

## 2020-09-24 ENCOUNTER — Encounter: Payer: Self-pay | Admitting: Physician Assistant

## 2020-09-24 VITALS — BP 142/94 | HR 60 | Ht 72.0 in | Wt 225.0 lb

## 2020-09-24 DIAGNOSIS — N201 Calculus of ureter: Secondary | ICD-10-CM | POA: Insufficient documentation

## 2020-09-24 MED ORDER — TAMSULOSIN HCL 0.4 MG PO CAPS: 0.4000 mg | ORAL_CAPSULE | Freq: Every day | ORAL | 0 refills | Status: DC

## 2020-09-24 MED ORDER — OXYCODONE-ACETAMINOPHEN 5-325 MG PO TABS: 1.0000 | ORAL_TABLET | Freq: Four times a day (QID) | ORAL | 0 refills | Status: DC | PRN

## 2020-09-24 NOTE — Progress Notes (Signed)
09/24/2020 5:16 PM   Thomas [name] [birth year] [id number]  CC: Chief Complaint  Patient presents with  . Nephrolithiasis    HPI: Thomas [name] is a 55 y.o. male who underwent ESWL with Dr. Diamantina Providence 14 days ago for management of a 4 x 8 mm mid left ureteral stone who presents today for postop follow-up.  Operative note significant for apparent smudging of the stone.  Today he reports doing well since his procedure.  He had an acute pain episode 4 days ago associated with nausea that has since resolved.  He is pain-free today.  He has not passed any fragments.  He denies fever, chills, nausea, and vomiting.  KUB today reveals a residual fragment that has migrated to the distal left ureter.  In-office UA today positive for trace intact blood and 1+ leukocyte esterase; urine microscopy with 11-30 WBCs/HPF, 3-10 RBCs/HPF, and granular casts.   PMH: Past Medical History:  Diagnosis Date  . Bipolar 1 disorder ([place])   . Hypertension   . Hypoglycemia     Surgical History: Past Surgical History:  Procedure Laterality Date  . EXTRACORPOREAL SHOCK WAVE LITHOTRIPSY Left 09/10/2020   Procedure: EXTRACORPOREAL SHOCK WAVE LITHOTRIPSY (ESWL);  Surgeon: Billey Co, MD;  Location: [facility] ORS;  Service: Urology;  Laterality: Left;  . HERNIA REPAIR    . INCISION AND DRAINAGE OF PERITONSILLAR ABCESS N/A 05/22/2015   Procedure: INCISION AND DRAINAGE OF PERITONSILLAR ABCESS;  Surgeon: Beverly Gust, MD;  Location: [facility] ORS;  Service: ENT;  Laterality: N/A;    Home Medications:  Allergies as of 09/24/2020      Reactions   Sulfa Antibiotics Hives      Medication List       Accurate as of Sep 24, 2020  5:16 PM. If you have any questions, ask your nurse or doctor.        STOP taking these medications   doxycycline 100 MG capsule Commonly known as: VIBRAMYCIN Stopped by: Debroah Loop, PA-C     TAKE these medications   amLODipine 5 MG tablet Commonly known as:  NORVASC Take 5 mg by mouth daily.   cyclobenzaprine 10 MG tablet Commonly known as: FLEXERIL Take 1 tablet (10 mg total) by mouth 3 (three) times daily as needed.   meloxicam 7.5 MG tablet Commonly known as: MOBIC Take 7.5 mg by mouth daily.   naproxen 500 MG tablet Commonly known as: Naprosyn Take 1 tablet (500 mg total) by mouth 2 (two) times daily with a meal.   ondansetron 4 MG disintegrating tablet Commonly known as: Zofran ODT Take 1 tablet (4 mg total) by mouth every 8 (eight) hours as needed for nausea or vomiting.   oxyCODONE-acetaminophen 5-325 MG tablet Commonly known as: PERCOCET/ROXICET Take 1-2 tablets by mouth every 6 (six) hours as needed for severe pain. Started by: Debroah Loop, PA-C   pravastatin 10 MG tablet Commonly known as: PRAVACHOL Take 10 mg by mouth daily.   rosuvastatin 10 MG tablet Commonly known as: CRESTOR Take 10 mg by mouth at bedtime.   tamsulosin 0.4 MG Caps capsule Commonly known as: FLOMAX Take 1 capsule (0.4 mg total) by mouth daily. Started by: Debroah Loop, PA-C   venlafaxine XR 75 MG 24 hr capsule Commonly known as: EFFEXOR-XR Take 1 capsule by mouth 2 (two) times daily.       Allergies:  Allergies  Allergen Reactions  . Sulfa Antibiotics Hives    Family History: Family History  Problem Relation Age of Onset  .  Aneurysm Mother   . Heart disease Father     Social History:   reports that he has been smoking cigarettes. He has a 82.00 pack-year smoking history. He has never used smokeless tobacco. He reports that he does not drink alcohol and does not use drugs.  Physical Exam: BP (!) 142/94   Pulse 60   Ht 6' (1.829 m)   Wt 225 lb (102.1 kg)   BMI 30.52 kg/m   Constitutional:  Alert and oriented, no acute distress, nontoxic appearing HEENT: [place], AT Cardiovascular: No clubbing, cyanosis, or edema Respiratory: Normal respiratory effort, no increased work of breathing Skin: No rashes, bruises  or suspicious lesions Neurologic: Grossly intact, no focal deficits, moving all 4 extremities Psychiatric: Normal mood and affect  Laboratory Data: Results for orders placed or performed in visit on 09/24/20  Microscopic Examination   Urine  Result Value Ref Range   WBC, UA 11-30 (A) 0 - 5 /hpf   RBC 3-10 (A) 0 - 2 /hpf   Epithelial Cells (non renal) 0-10 0 - 10 /hpf   Renal Epithel, UA 0-10 (A) None seen /hpf   Casts Present (A) None seen /lpf   Cast Type Granular casts (A) N/A   Bacteria, UA Present (A) None seen/Few  Urinalysis, Complete  Result Value Ref Range   Specific Gravity, UA 1.015 1.005 - 1.030   pH, UA 5.0 5.0 - 7.5   Color, UA Yellow Yellow   Appearance Ur Cloudy (A) Clear   Leukocytes,UA 1+ (A) Negative   Protein,UA Negative Negative/Trace   Glucose, UA Negative Negative   Ketones, UA Negative Negative   RBC, UA Trace (A) Negative   Bilirubin, UA Negative Negative   Urobilinogen, Ur 0.2 0.2 - 1.0 mg/dL   Nitrite, UA Negative Negative   Microscopic Examination See below:    Pertinent Imaging: KUB, 09/24/2020: CLINICAL DATA:  Nephrolithiasis.  EXAM: ABDOMEN - 1 VIEW  COMPARISON:  Sep 10, 2020.  FINDINGS: The bowel gas pattern is normal. Stable left renal calculus is noted compared to prior exam. Stable small right renal calculus is noted. Phleboliths are noted in the right side of the pelvis. New lobular calcification is noted in the left side of pelvis which may represent left ureteral calculus.  IMPRESSION: Stable bilateral nephrolithiasis. New lobular calcification is seen in the left side of the pelvis which may represent distal left ureteral calculus.   Electronically Signed   By: Marijo Conception M.D.   On: 09/25/2020 14:18  I personally reviewed the images referenced above and note a residual fragment at the distal left ureter.  Assessment & Plan:   1. Left ureteral stone Pain resolved, however UA and KUB suggestive of retained  fragment at the distal left ureter.  Refilling Flomax and Percocet and counseled patient to continue pushing fluids to help pass this fragment.  We will plan for repeat UA and KUB in 2 weeks.  He is in agreement with this plan. - Urinalysis, Complete - DG Abd 1 View; Future - tamsulosin (FLOMAX) 0.4 MG CAPS capsule; Take 1 capsule (0.4 mg total) by mouth daily.  Dispense: 30 capsule; Refill: 0 - oxyCODONE-acetaminophen (PERCOCET/ROXICET) 5-325 MG tablet; Take 1-2 tablets by mouth every 6 (six) hours as needed for severe pain.  Dispense: 4 tablet; Refill: 0  Return in about 2 weeks (around 10/08/2020) for Stone f/u with UA + KUB prior.  Debroah Loop, PA-C  [facility] [address] [place], [place] [address] [phone]    

## 2020-09-30 LAB — MICROSCOPIC EXAMINATION

## 2020-09-30 LAB — URINALYSIS, COMPLETE
Bilirubin, UA: NEGATIVE
Glucose, UA: NEGATIVE
Ketones, UA: NEGATIVE
Nitrite, UA: NEGATIVE
Protein,UA: NEGATIVE
Specific Gravity, UA: 1.015 (ref 1.005–1.030)
Urobilinogen, Ur: 0.2 mg/dL (ref 0.2–1.0)
pH, UA: 5 (ref 5.0–7.5)

## 2020-10-08 ENCOUNTER — Ambulatory Visit
Admission: RE | Admit: 2020-10-08 | Discharge: 2020-10-08 | Disposition: A | Discharge status: Home / Self Care | Payer: 59 | Attending: Physician Assistant | Admitting: Physician Assistant

## 2020-10-08 ENCOUNTER — Ambulatory Visit
Admission: RE | Admit: 2020-10-08 | Discharge: 2020-10-08 | Disposition: A | Discharge status: Home / Self Care | Payer: 59 | Source: Ambulatory Visit | Attending: Physician Assistant | Admitting: Physician Assistant

## 2020-10-08 ENCOUNTER — Other Ambulatory Visit: Payer: Self-pay

## 2020-10-08 ENCOUNTER — Ambulatory Visit (INDEPENDENT_AMBULATORY_CARE_PROVIDER_SITE_OTHER): Payer: 59 | Admitting: Physician Assistant

## 2020-10-08 VITALS — BP 157/86 | HR 61 | Ht 72.0 in | Wt 220.0 lb

## 2020-10-08 DIAGNOSIS — N201 Calculus of ureter: Secondary | ICD-10-CM

## 2020-10-08 LAB — MICROSCOPIC EXAMINATION: Bacteria, UA: NONE SEEN

## 2020-10-08 LAB — URINALYSIS, COMPLETE
Bilirubin, UA: NEGATIVE
Glucose, UA: NEGATIVE
Ketones, UA: NEGATIVE
Nitrite, UA: NEGATIVE
Protein,UA: NEGATIVE
Specific Gravity, UA: 1.025 (ref 1.005–1.030)
Urobilinogen, Ur: 0.2 mg/dL (ref 0.2–1.0)
pH, UA: 5 (ref 5.0–7.5)

## 2020-10-08 NOTE — Progress Notes (Signed)
10/08/2020 10:18 AM   Thomas [name] [birth year] [id number]  CC: Chief Complaint  Patient presents with   Nephrolithiasis   HPI: Thomas [name] is a 55 y.o. male s/p ESWL with Thomas [name] on 09/10/2020 for management of a 4 x 8 mm mid left ureteral stone who presents today for postop follow-up.  I saw him in clinic on POD 14, at which point KUB revealed interval migration of a residual fragment to the distal left ureter.  UA was notable for pyuria and microscopic hematuria.  He was counseled to continue Flomax, push fluids, and RTC in 2 more weeks.   Today he reports a twinge of left flank pain yesterday associated with heavy lifting.  He has not seen any fragments pass.  KUB today reveals interval clearance of the residual distal left ureteral fragment.  In-office UA today positive for trace intact blood and 1+ leukocyte esterase; urine microscopy with 11-30 WBCs/HPF.   Regarding his diet, he reports previously drinking lots of soda and tea, however he has switched to lemonade and Sprite since being diagnosed with his recent stone.  He also has GERD, managed by his PCP, and reports significant Tums intake.  PMH: Past Medical History:  Diagnosis Date   Bipolar 1 disorder ([place])    Hypertension    Hypoglycemia     Surgical History: Past Surgical History:  Procedure Laterality Date   EXTRACORPOREAL SHOCK WAVE LITHOTRIPSY Left 09/10/2020   Procedure: EXTRACORPOREAL SHOCK WAVE LITHOTRIPSY (ESWL);  Surgeon: Thomas Co, MD;  Location: [facility] ORS;  Service: Urology;  Laterality: Left;   HERNIA REPAIR     INCISION AND DRAINAGE OF PERITONSILLAR ABCESS N/A 05/22/2015   Procedure: INCISION AND DRAINAGE OF PERITONSILLAR ABCESS;  Surgeon: Thomas Gust, MD;  Location: [facility] ORS;  Service: ENT;  Laterality: N/A;    Home Medications:  Allergies as of 10/08/2020       Reactions   Sulfa Antibiotics Hives        Medication List        Accurate as of October 08, 2020 10:18 AM. If  you have any questions, ask your nurse or doctor.          amLODipine 5 MG tablet Commonly known as: NORVASC Take 5 mg by mouth daily.   cyclobenzaprine 10 MG tablet Commonly known as: FLEXERIL Take 1 tablet (10 mg total) by mouth 3 (three) times daily as needed.   meloxicam 7.5 MG tablet Commonly known as: MOBIC Take 7.5 mg by mouth daily.   naproxen 500 MG tablet Commonly known as: Naprosyn Take 1 tablet (500 mg total) by mouth 2 (two) times daily with a meal.   ondansetron 4 MG disintegrating tablet Commonly known as: Zofran ODT Take 1 tablet (4 mg total) by mouth every 8 (eight) hours as needed for nausea or vomiting.   oxyCODONE-acetaminophen 5-325 MG tablet Commonly known as: PERCOCET/ROXICET Take 1-2 tablets by mouth every 6 (six) hours as needed for severe pain.   pravastatin 10 MG tablet Commonly known as: PRAVACHOL Take 10 mg by mouth daily.   rosuvastatin 10 MG tablet Commonly known as: CRESTOR Take 10 mg by mouth at bedtime.   tamsulosin 0.4 MG Caps capsule Commonly known as: FLOMAX Take 1 capsule (0.4 mg total) by mouth daily.   venlafaxine XR 75 MG 24 hr capsule Commonly known as: EFFEXOR-XR Take 1 capsule by mouth 2 (two) times daily.        Allergies:  Allergies  Allergen Reactions  Sulfa Antibiotics Hives    Family History: Family History  Problem Relation Age of Onset   Aneurysm Mother    Heart disease Father     Social History:   reports that he has been smoking cigarettes. He has a 82.00 pack-year smoking history. He has never used smokeless tobacco. He reports that he does not drink alcohol and does not use drugs.  Physical Exam: BP (!) 157/86   Pulse 61   Ht 6' (1.829 m)   Wt 220 lb (99.8 kg)   BMI 29.84 kg/m   Constitutional:  Alert and oriented, no acute distress, nontoxic appearing HEENT: [place], AT Cardiovascular: No clubbing, cyanosis, or edema Respiratory: Normal respiratory effort, no increased work of  breathing Skin: No rashes, bruises or suspicious lesions Neurologic: Grossly intact, no focal deficits, moving all 4 extremities Psychiatric: Normal mood and affect  Laboratory Data: Results for orders placed or performed in visit on 10/08/20  CULTURE, URINE COMPREHENSIVE   Specimen: Urine   UR  Result Value Ref Range   Urine Culture, Comprehensive Final report    Organism ID, Bacteria Comment   Microscopic Examination   Urine  Result Value Ref Range   WBC, UA 11-30 (A) 0 - 5 /hpf   RBC 0-2 0 - 2 /hpf   Epithelial Cells (non renal) 0-10 0 - 10 /hpf   Casts Present (A) None seen /lpf   Cast Type Hyaline casts N/A   Bacteria, UA None seen None seen/Few  Urinalysis, Complete  Result Value Ref Range   Specific Gravity, UA 1.025 1.005 - 1.030   pH, UA 5.0 5.0 - 7.5   Color, UA Yellow Yellow   Appearance Ur Cloudy (A) Clear   Leukocytes,UA 1+ (A) Negative   Protein,UA Negative Negative/Trace   Glucose, UA Negative Negative   Ketones, UA Negative Negative   RBC, UA Trace (A) Negative   Bilirubin, UA Negative Negative   Urobilinogen, Ur 0.2 0.2 - 1.0 mg/dL   Nitrite, UA Negative Negative   Microscopic Examination See below:    Pertinent Imaging: KUB, 10/08/2020: CLINICAL DATA:  Distal left ureteral stone. Left-sided pain for a year. History of kidney stones. Lithotripsy 1 month ago.   EXAM: ABDOMEN - 1 VIEW   COMPARISON:  Sep 24, 2020   FINDINGS: Both kidneys are largely obscured by bowel contents. No stones are definitively seen in either kidney. The left distal ureteral stone seen previously is no longer present. Phleboliths are seen in the right pelvis. No other abnormalities.   IMPRESSION: Both kidneys are largely obscured by bowel contents. No definite renal stones. No ureteral stones noted.     Electronically Signed   By: Thomas [name] M.D   On: 10/10/2020 20:09  I personally reviewed the images referenced above and note interval clearance of the  distal left ureteral fragment.  Assessment & Plan:   1. Left ureteral stone Interval clearance of the stone on KUB and resolution of MH on UA today.  He had some twinges of left flank pain yesterday associated with heavy lifting, likely MSK in origin.  Will send for culture given persistent pyuria.  I explained that x-ray is not a definitive test for stones, however in the absence of severe persistent pain, I would like to defer further imaging at this time.  Patient is in agreement with this plan.  We discussed return precautions including persistent and/or worsening flank pain.  We will consider repeat stone study at that time.  I counseled  the patient on general stone prevention techniques including increasing water intake with a goal of producing 2.5L of urine daily, decreasing intake of high oxalate-containing foods, increasing citric acid intake, maintaining moderate dietary calcium intake, and decreasing salt intake.  Written and verbal guidance provided today.  - Urinalysis, Complete - CULTURE, URINE COMPREHENSIVE   Return if symptoms worsen or fail to improve.  Debroah Loop, PA-C  [facility] [address], [place], [place] [address] [phone]

## 2020-10-11 LAB — CULTURE, URINE COMPREHENSIVE

## 2021-02-05 ENCOUNTER — Other Ambulatory Visit: Payer: Self-pay

## 2021-02-05 ENCOUNTER — Emergency Department: Payer: Worker's Compensation

## 2021-02-05 ENCOUNTER — Emergency Department
Admission: EM | Admit: 2021-02-05 | Discharge: 2021-02-05 | Disposition: A | Discharge status: Home / Self Care | Payer: Worker's Compensation | Attending: Emergency Medicine | Admitting: Emergency Medicine

## 2021-02-05 DIAGNOSIS — F17210 Nicotine dependence, cigarettes, uncomplicated: Secondary | ICD-10-CM | POA: Insufficient documentation

## 2021-02-05 DIAGNOSIS — Z79899 Other long term (current) drug therapy: Secondary | ICD-10-CM | POA: Insufficient documentation

## 2021-02-05 DIAGNOSIS — S61412A Laceration without foreign body of left hand, initial encounter: Secondary | ICD-10-CM

## 2021-02-05 DIAGNOSIS — I10 Essential (primary) hypertension: Secondary | ICD-10-CM | POA: Insufficient documentation

## 2021-02-05 DIAGNOSIS — S6992XA Unspecified injury of left wrist, hand and finger(s), initial encounter: Secondary | ICD-10-CM | POA: Diagnosis present

## 2021-02-05 DIAGNOSIS — W208XXA Other cause of strike by thrown, projected or falling object, initial encounter: Secondary | ICD-10-CM | POA: Diagnosis not present

## 2021-02-05 MED ORDER — LIDOCAINE HCL 1 % IJ SOLN
10.0000 mL | Freq: Once | INTRAMUSCULAR | Status: AC
  Administered 2021-02-05: 10 mL
  Filled 2021-02-05: qty 10

## 2021-02-05 MED ORDER — CEPHALEXIN 500 MG PO CAPS: 500.0000 mg | ORAL_CAPSULE | Freq: Four times a day (QID) | ORAL | 0 refills | Status: AC

## 2021-02-05 MED ORDER — OXYCODONE-ACETAMINOPHEN 5-325 MG PO TABS
2.0000 | ORAL_TABLET | Freq: Once | ORAL | Status: AC
  Administered 2021-02-05: 2 via ORAL
  Filled 2021-02-05: qty 2

## 2021-02-05 MED ORDER — ONDANSETRON 4 MG PO TBDP
4.0000 mg | ORAL_TABLET | Freq: Once | ORAL | Status: AC
  Administered 2021-02-05: 4 mg via ORAL
  Filled 2021-02-05: qty 1

## 2021-02-05 NOTE — Discharge Instructions (Addendum)
Take Keflex four times daily for the next seven days. Have sutures removed in ten days.  

## 2021-02-05 NOTE — ED Triage Notes (Addendum)
Pt to ER via POV with injury present to left pinky finger. Pt was working on his monster truck, placed the tyres into a trailer, one of the tyres fell on the bottom portion of his left little finger. Reports having to rip his finger out from under it. Bleeding well controlled at this time with guaze dressing.   Last tetanus less than five years ago.

## 2021-02-05 NOTE — ED Notes (Addendum)
First Nurse Note:  Pt to ED for laceration to left hand. Bleeding controlled at this time.

## 2021-02-05 NOTE — ED Provider Notes (Signed)
[facility]EMERGENCY DEPARTMENT  ____________________________________________  Time seen: Approximately 5:24 PM  I have reviewed the triage vital signs and the nursing notes.   HISTORY  Chief Complaint Extremity Laceration   Historian Patient     HPI Thomas [name] is a 55 y.o. male presents to the emergency department with a 3 cm left fifth digit laceration deep to underlying adipose tissue sustained after a large tire fell on patient's hand.  No numbness or tingling in the left fifth digit.  Patient cannot recall his last tetanus shot.  No similar injuries in the past.   Past Medical History:  Diagnosis Date   Bipolar 1 disorder ([place])    Hypertension    Hypoglycemia      Immunizations up to date:  Yes.     Past Medical History:  Diagnosis Date   Bipolar 1 disorder ([place])    Hypertension    Hypoglycemia     Patient Active Problem List   Diagnosis Date Noted   Peritonsillar abscess 05/22/2015   Tonsil, abscess 05/22/2015    Past Surgical History:  Procedure Laterality Date   EXTRACORPOREAL SHOCK WAVE LITHOTRIPSY Left 09/10/2020   Procedure: EXTRACORPOREAL SHOCK WAVE LITHOTRIPSY (ESWL);  Surgeon: Billey Co, MD;  Location: [facility] ORS;  Service: Urology;  Laterality: Left;   HERNIA REPAIR     INCISION AND DRAINAGE OF PERITONSILLAR ABCESS N/A 05/22/2015   Procedure: INCISION AND DRAINAGE OF PERITONSILLAR ABCESS;  Surgeon: Beverly Gust, MD;  Location: [facility] ORS;  Service: ENT;  Laterality: N/A;    Prior to Admission medications   Medication Sig Start Date End Date Taking? Authorizing Provider  cephALEXin (KEFLEX) 500 MG capsule Take 1 capsule (500 mg total) by mouth 4 (four) times daily for 7 days. 02/05/21 02/12/21 Yes Vallarie Mare M, PA-C  amLODipine (NORVASC) 5 MG tablet Take 5 mg by mouth daily.    [provider]  meloxicam (MOBIC) 7.5 MG tablet Take 7.5 mg by mouth daily. 08/31/20   [provider]  pravastatin (PRAVACHOL) 10 MG tablet  Take 10 mg by mouth daily.    [provider]  rosuvastatin (CRESTOR) 10 MG tablet Take 10 mg by mouth at bedtime. 07/20/20   [provider]  tamsulosin (FLOMAX) 0.4 MG CAPS capsule Take 1 capsule (0.4 mg total) by mouth daily. 09/24/20   Debroah Loop, PA-C  venlafaxine XR (EFFEXOR-XR) 75 MG 24 hr capsule Take 1 capsule by mouth 2 (two) times daily. 08/20/14   [provider]    Allergies Sulfa antibiotics  Family History  Problem Relation Age of Onset   Aneurysm Mother    Heart disease Father     Social History Social History   Tobacco Use   Smoking status: Every Day    Packs/day: 2.00    Years: 41.00    Pack years: 82.00    Types: Cigarettes   Smokeless tobacco: Never  Vaping Use   Vaping Use: Never used  Substance Use Topics   Alcohol use: No   Drug use: No     Review of Systems  Constitutional: No fever/chills Eyes:  No discharge ENT: No upper respiratory complaints. Respiratory: no cough. No SOB/ use of accessory muscles to breath Gastrointestinal:   No nausea, no vomiting.  No diarrhea.  No constipation. Musculoskeletal: Patient has left fifth digit pain.  Skin: Patient has left fifth digit laceration.    ____________________________________________   PHYSICAL EXAM:  VITAL SIGNS: ED Triage Vitals  Enc Vitals Group  BP 02/05/21 1539 (!) 187/99     Pulse Rate 02/05/21 1539 80     Resp 02/05/21 1539 18     Temp 02/05/21 1539 98.6 F (37 C)     Temp Source 02/05/21 1539 Oral     SpO2 02/05/21 1539 95 %     Weight 02/05/21 1656 220 lb 0.3 oz (99.8 kg)     Height 02/05/21 1540 6' (1.829 m)     Head Circumference --      Peak Flow --      Pain Score 02/05/21 1540 7     Pain Loc --      Pain Edu? --      Excl. in [place]? --      Constitutional: Alert and oriented. Well appearing and in no acute distress. Eyes: Conjunctivae are normal. PERRL. EOMI. Head: Atraumatic. ENT: Cardiovascular: Normal rate, regular  rhythm. Normal S1 and S2.  Good peripheral circulation. Respiratory: Normal respiratory effort without tachypnea or retractions. Lungs CTAB. Good air entry to the bases with no decreased or absent breath sounds Gastrointestinal: Bowel sounds x 4 quadrants. Soft and nontender to palpation. No guarding or rigidity. No distention. Musculoskeletal: Full range of motion to all extremities.  No flexor or extensor tendon deficits appreciated with left fifth digit testing.  Palpable radial ulnar pulses bilaterally and symmetrically. Neurologic:  Normal for age. No gross focal neurologic deficits are appreciated.  Skin: Patient has 3 cm linear laceration deep to underlying adipose tissue at base of left fifth digit.  Psychiatric: Mood and affect are normal for age. Speech and behavior are normal.   ____________________________________________   LABS (all labs ordered are listed, but only abnormal results are displayed)  Labs Reviewed - No data to display ____________________________________________  EKG   ____________________________________________  RADIOLOGY Unk Pinto, personally viewed and evaluated these images (plain radiographs) as part of my medical decision making, as well as reviewing the written report by the radiologist.  DG Finger Little Left  Result Date: 02/05/2021 CLINICAL DATA:  Finger injury EXAM: LEFT LITTLE FINGER 2+V COMPARISON:  None. FINDINGS: Acute mildly displaced intra-articular fracture involving the base of the fifth middle phalanx. No subluxation IMPRESSION: Acute intra-articular fracture involving base of fifth middle phalanx. Electronically Signed   By: Donavan Foil M.D.   On: 02/05/2021 16:23    ____________________________________________    PROCEDURES  Procedure(s) performed:     Procedures     Medications  lidocaine (XYLOCAINE) 1 % (with pres) injection 10 mL (10 mLs Infiltration Given by Other 02/05/21 1801)  oxyCODONE-acetaminophen  (PERCOCET/ROXICET) 5-325 MG per tablet 2 tablet (2 tablets Oral Given 02/05/21 1720)  ondansetron (ZOFRAN-ODT) disintegrating tablet 4 mg (4 mg Oral Given 02/05/21 1720)     ____________________________________________   INITIAL IMPRESSION / ASSESSMENT AND PLAN / ED COURSE  Pertinent labs & imaging results that were available during my care of the patient were reviewed by me and considered in my medical decision making (see chart for details).      Assessment and plan:  Finger laceration:  55 year old male presents to the emergency department with a 2 cm left fifth digit laceration repaired in the emergency department without complication.  Patient has a nondisplaced fracture of the left fifth middle phalanx.  Patient's left fifth digit was splinted into extension and dressed after laceration repair.  Patient had a copious amount of grease along the left fifth hand and wound was scrubbed 3 times with surgical soap.  Explained to patient  he is at high risk for infection given amount of dirt/oil that was on left hand.  We will treat patient for open fracture with Keflex 4 times daily for the next 7 days with follow-up to orthopedist, Dr. Sabra Heck.  Return precautions were given to return with redness or streaking surrounding the wound site.  All patient questions were answered.     ____________________________________________  FINAL CLINICAL IMPRESSION(S) / ED DIAGNOSES  Final diagnoses:  Laceration of left hand without foreign body, initial encounter      NEW MEDICATIONS STARTED DURING THIS VISIT:  ED Discharge Orders          Ordered    cephALEXin (KEFLEX) 500 MG capsule  4 times daily        02/05/21 1857                This chart was dictated using voice recognition software/Dragon. Despite best efforts to proofread, errors can occur which can change the meaning. Any change was purely unintentional.     Karren Cobble 02/05/21 Oneal Deputy,  MD 02/05/21 Joen Laura

## 2021-03-05 ENCOUNTER — Other Ambulatory Visit: Payer: Self-pay | Admitting: Orthopedic Surgery

## 2021-03-05 DIAGNOSIS — S61217D Laceration without foreign body of left little finger without damage to nail, subsequent encounter: Secondary | ICD-10-CM

## 2021-03-05 DIAGNOSIS — S62627D Displaced fracture of medial phalanx of left little finger, subsequent encounter for fracture with routine healing: Secondary | ICD-10-CM

## 2021-03-31 ENCOUNTER — Ambulatory Visit (INDEPENDENT_AMBULATORY_CARE_PROVIDER_SITE_OTHER): Payer: Self-pay | Admitting: Dermatology

## 2021-03-31 ENCOUNTER — Other Ambulatory Visit: Payer: Self-pay

## 2021-03-31 DIAGNOSIS — L578 Other skin changes due to chronic exposure to nonionizing radiation: Secondary | ICD-10-CM

## 2021-03-31 DIAGNOSIS — R21 Rash and other nonspecific skin eruption: Secondary | ICD-10-CM

## 2021-03-31 DIAGNOSIS — L820 Inflamed seborrheic keratosis: Secondary | ICD-10-CM

## 2021-03-31 DIAGNOSIS — L821 Other seborrheic keratosis: Secondary | ICD-10-CM

## 2021-03-31 DIAGNOSIS — D489 Neoplasm of uncertain behavior, unspecified: Secondary | ICD-10-CM

## 2021-03-31 DIAGNOSIS — L918 Other hypertrophic disorders of the skin: Secondary | ICD-10-CM

## 2021-03-31 MED ORDER — CLOBETASOL PROPIONATE 0.05 % EX OINT: 1.0000 "application " | TOPICAL_OINTMENT | Freq: Two times a day (BID) | CUTANEOUS | 0 refills | Status: AC

## 2021-03-31 MED ORDER — FLUCONAZOLE 150 MG PO TABS
ORAL_TABLET | ORAL | 0 refills | Status: DC
Start: 2021-03-31 — End: 2021-05-05

## 2021-03-31 NOTE — Patient Instructions (Addendum)
Recommend OTC [place] Rapid Relief Anti-Itch cream (pramoxine + menthol), CeraVe Anti-itch cream or lotion (pramoxine), Sarna lotion (Original- menthol + camphor or Sensitive- pramoxine) or Eucerin 12 hour Itch Relief lotion (menthol) up to 3 times per day to areas on body that are itchy.  Cryotherapy Aftercare  Wash gently with soap and water everyday.   Apply Vaseline and Band-Aid daily until healed.    Topical steroids (such as triamcinolone, fluocinolone, fluocinonide, mometasone, clobetasol, halobetasol, betamethasone, hydrocortisone) can cause thinning and lightening of the skin if they are used for too long in the same area. Your physician has selected the right strength medicine for your problem and area affected on the body. Please use your medication only as directed by your physician to prevent side effects.    If You Need Anything After Your Visit  If you have any questions or concerns for your doctor, please call our main line at [phone] and press option 4 to reach your doctor's medical assistant. If no one answers, please leave a voicemail as directed and we will return your call as soon as possible. Messages left after 4 pm will be answered the following business day.   You may also send [place] a message via [place]. We typically respond to MyChart messages within 1-2 business days.  For prescription refills, please ask your pharmacy to contact our office. Our fax number is [phone].  If you have an urgent issue when the clinic is closed that cannot wait until the next business day, you can page your doctor at the number below.    Please note that while we do our best to be available for urgent issues outside of office hours, we are not available 24/7.   If you have an urgent issue and are unable to reach [place], you may choose to seek medical care at your doctor's office, retail clinic, urgent care center, or emergency room.  If you have a medical emergency, please  immediately call 911 or go to the emergency department.  Pager Numbers  - Dr. Nehemiah Massed: [phone]  - Dr. Laurence Ferrari: [phone]  - Dr. Nicole Kindred: [phone]  In the event of inclement weather, please call our main line at [phone] for an update on the status of any delays or closures.  Dermatology Medication Tips: Please keep the boxes that topical medications come in in order to help keep track of the instructions about where and how to use these. Pharmacies typically print the medication instructions only on the boxes and not directly on the medication tubes.   If your medication is too expensive, please contact our office at [phone] option 4 or send [place] a message through [place].   We are unable to tell what your co-pay for medications will be in advance as this is different depending on your insurance coverage. However, we may be able to find a substitute medication at lower cost or fill out paperwork to get insurance to cover a needed medication.   If a prior authorization is required to get your medication covered by your insurance company, please allow [place] 1-2 business days to complete this process.  Drug prices often vary depending on where the prescription is filled and some pharmacies may offer cheaper prices.  The website www.goodrx.com contains coupons for medications through different pharmacies. The prices here do not account for what the cost may be with help from insurance (it may be cheaper with your insurance), but the website can give you the price if you did not  use any insurance.  - You can print the associated coupon and take it with your prescription to the pharmacy.  - You may also stop by our office during regular business hours and pick up a GoodRx coupon card.  - If you need your prescription sent electronically to a different pharmacy, notify our office through [facility] or by phone at [phone] option 4.     Si Usted Necesita Algo Despus  de Su Visita  Tambin puede enviarnos un mensaje a travs de Pharmacist, community. Por lo general respondemos a los mensajes de MyChart en el transcurso de 1 a 2 das hbiles.  Para renovar recetas, por favor pida a su farmacia que se ponga en contacto con nuestra oficina. Harland Dingwall de fax es [place] [phone].  Si tiene un asunto urgente cuando la clnica est cerrada y que no puede esperar hasta el siguiente da hbil, puede llamar/localizar a su doctor(a) al nmero que aparece a continuacin.   Por favor, tenga en cuenta que aunque hacemos todo lo posible para estar disponibles para asuntos urgentes fuera del horario de [place], no estamos disponibles las 24 horas del da, los 7 das de la [place].   Si tiene un problema urgente y no puede comunicarse con nosotros, puede optar por buscar atencin mdica  en el consultorio de su doctor(a), en una clnica privada, en un centro de atencin urgente o en una sala de emergencias.  Si tiene Engineering geologist, por favor llame inmediatamente al 911 o vaya a la sala de emergencias.  Nmeros de bper  - Dr. Nehemiah Massed: [phone]  - Dra. Moye: [phone]  - Dra. Nicole Kindred: [phone]  En caso de inclemencias del [place], por favor llame a Johnsie Kindred principal al [phone] para una actualizacin sobre el [place] de cualquier retraso o cierre.  Consejos para la medicacin en dermatologa: Por favor, guarde las cajas en las que vienen los medicamentos de uso tpico para ayudarle a seguir las instrucciones sobre dnde y cmo usarlos. Las farmacias generalmente imprimen las instrucciones del medicamento slo en las cajas y no directamente en los tubos del [place].   Si su medicamento es muy caro, por favor, pngase en contacto con Zigmund Daniel llamando al [phone] y presione la opcin 4 o envenos un mensaje a travs de Pharmacist, community.   No podemos decirle cul ser su copago por los medicamentos por adelantado ya que esto es diferente dependiendo  de la cobertura de su seguro. Sin embargo, es posible que podamos encontrar un medicamento sustituto a Electrical engineer un formulario para que el seguro cubra el medicamento que se considera necesario.   Si se requiere una autorizacin previa para que su compaa de seguros [place] su medicamento, por favor permtanos de 1 a 2 das hbiles para completar este proceso.  Los precios de los medicamentos varan con frecuencia dependiendo del Environmental consultant de dnde se surte la receta y alguna farmacias pueden ofrecer precios ms baratos.  El sitio web www.goodrx.com tiene cupones para medicamentos de Airline pilot. Los precios aqu no tienen en cuenta lo que podra costar con la ayuda del seguro (puede ser ms barato con su seguro), pero el sitio web puede darle el precio si no utiliz Research scientist (physical sciences).  - Puede imprimir el cupn correspondiente y llevarlo con su receta a la farmacia.  - Tambin puede pasar por nuestra oficina durante el horario de atencin regular y Charity fundraiser una tarjeta de cupones de GoodRx.  - Si necesita que su receta se enve electrnicamente  enve electrnicamente a una farmacia diferente, informe a nuestra oficina a travs de MyChart de [place] o por telfono llamando al [phone] y presione la opcin 4.  

## 2021-03-31 NOTE — Progress Notes (Signed)
New Patient Visit  Subjective  Thomas [name] is a 55 y.o. male who presents for the following: New Patient (Initial Visit) (Patient here today for spots at neck. He reports had spot treated with cryotherapy. Patient reports mother and dad may have had some history of skin cancer but not sure today. Denies any personal history of skin cancer. ).   The following portions of the chart were reviewed this encounter and updated as appropriate:   Tobacco  Allergies  Meds  Problems  Med Hx  Surg Hx  Fam Hx      Review of Systems:  No other skin or systemic complaints except as noted in HPI or Assessment and Plan.  Objective  Well appearing patient in no apparent distress; mood and affect are within normal limits.  A focused examination was performed including face, chest, back, neck, arms, left posterior thigh, scrotum . Relevant physical exam findings are noted in the Assessment and Plan.  right neck Erythematous skin colored pedunculated papule  scrotum Erythematous patches   chest x 8, left temple x 2, right forehead x 2 (12) Erythematous keratotic or waxy stuck-on papule or plaque.   Left Thigh - Posterior 1 cm pedunculated erythematous papule 0.5 cm across base   Assessment & Plan  Acrochordon right neck  Deferred treatment today  Benign-appearing.  Observation.  Call clinic for new or changing lesions.      Rash and other nonspecific skin eruption scrotum  Ddx yeast vs lichen simplex chronicus. Has failed triamcinolone.  Not enough scale for KOH prep today  Start Fluconazole 150 mg tablet - take one tab po now and repeat 1 tab po in 1 week if still itchy.   Start Clobetasol ointment twice a day for up to 3 weeks for rash   Recommend OTC Gold Bond Rapid Relief Anti-Itch cream (pramoxine + menthol), CeraVe Anti-itch cream or lotion (pramoxine), Sarna lotion (Original- menthol + camphor or Sensitive- pramoxine) or Eucerin 12 hour Itch Relief lotion  (menthol) up to 3 times per day to areas on body that are itchy.  Topical steroids (such as triamcinolone, fluocinolone, fluocinonide, mometasone, clobetasol, halobetasol, betamethasone, hydrocortisone) can cause thinning and lightening of the skin if they are used for too long in the same area. Your physician has selected the right strength medicine for your problem and area affected on the body. Please use your medication only as directed by your physician to prevent side effects.     fluconazole (DIFLUCAN) 150 MG tablet - scrotum Take one tablet by mouth now, repeat one tablet by mouth in 1 week if still itchy  clobetasol ointment (TEMOVATE) 0.05 % - scrotum Apply 1 application topically 2 (two) times daily. Use for up to 3 weeks at scrotum for rash. Avoid applying to face and axilla.  Inflamed seborrheic keratosis chest x 8, left temple x 2, right forehead x 2  Prior to procedure, discussed risks of blister formation, small wound, skin dyspigmentation, or rare scar following cryotherapy. Recommend Vaseline ointment to treated areas while healing.   Destruction of lesion - chest x 8, left temple x 2, right forehead x 2 Complexity: simple   Destruction method: cryotherapy   Informed consent: discussed and consent obtained   Timeout:  patient name, date of birth, surgical site, and procedure verified Lesion destroyed using liquid nitrogen: Yes   Region frozen until ice ball extended beyond lesion: Yes   Outcome: patient tolerated procedure well with no complications   Post-procedure details: wound  care instructions given   Additional details:  Prior to procedure, discussed risks of blister formation, small wound, skin dyspigmentation, or rare scar following cryotherapy. Recommend Vaseline ointment to treated areas while healing.   Neoplasm of uncertain behavior Left Thigh - Posterior  R/o lipofibroma   Benign appearing but symptomatic  Plan for shave removal at next follow up in  January    Seborrheic Keratoses - Stuck-on, waxy, tan-brown papules and/or plaques  - Benign-appearing - Discussed benign etiology and prognosis. - Observe - Call for any changes  Acrochordons (Skin Tags) - Fleshy, skin-colored pedunculated papules  - Benign appearing.  - Observe. - If desired, they can be removed with an in office procedure that is not covered by insurance. - Please call the clinic if you notice any new or changing lesions.  Actinic Damage - chronic, secondary to cumulative UV radiation exposure/sun exposure over time - diffuse scaly erythematous macules with underlying dyspigmentation - Recommend daily broad spectrum sunscreen SPF 30+ to sun-exposed areas, reapply every 2 hours as needed.  - Recommend staying in the shade or wearing long sleeves, sun glasses (UVA+UVB protection) and wide brim hats (4-inch brim around the entire circumference of the hat). - Call for new or changing lesions.  Return for follow up in january .  I, Ruthell Rummage, CMA, am acting as scribe for Forest Gleason, MD.  Documentation: I have reviewed the above documentation for accuracy and completeness, and I agree with the above.  Forest Gleason, MD

## 2021-04-05 ENCOUNTER — Encounter: Payer: Self-pay | Admitting: Dermatology

## 2021-05-05 ENCOUNTER — Ambulatory Visit: Payer: 59 | Admitting: Dermatology

## 2021-05-05 ENCOUNTER — Other Ambulatory Visit: Payer: Self-pay

## 2021-05-05 DIAGNOSIS — R21 Rash and other nonspecific skin eruption: Secondary | ICD-10-CM

## 2021-05-05 DIAGNOSIS — D1724 Benign lipomatous neoplasm of skin and subcutaneous tissue of left leg: Secondary | ICD-10-CM

## 2021-05-05 DIAGNOSIS — D485 Neoplasm of uncertain behavior of skin: Secondary | ICD-10-CM

## 2021-05-05 DIAGNOSIS — L739 Follicular disorder, unspecified: Secondary | ICD-10-CM

## 2021-05-05 MED ORDER — PIMECROLIMUS 1 % EX CREA: TOPICAL_CREAM | Freq: Two times a day (BID) | CUTANEOUS | 1 refills | Status: DC

## 2021-05-05 MED ORDER — FLUCONAZOLE 150 MG PO TABS: 150.0000 mg | ORAL_TABLET | Freq: Every day | ORAL | 0 refills | Status: DC

## 2021-05-05 MED ORDER — FLUCONAZOLE 150 MG PO TABS: ORAL_TABLET | ORAL | 0 refills | Status: DC

## 2021-05-05 NOTE — Progress Notes (Signed)
° °  Follow-Up Visit   Subjective  Thomas [name] is a 56 y.o. male who presents for the following: Follow-up (Patient here today to follow up for ISK's treated at last visit with LN2. Patient was also seen for rash at scrotum and was given fluconazole and clobetasol ointment, rash has improved a little bit. Last office note states to return for bx.).  The following portions of the chart were reviewed this encounter and updated as appropriate:   Tobacco   Allergies   Meds   Problems   Med Hx   Surg Hx   Fam Hx       Review of Systems:  No other skin or systemic complaints except as noted in HPI or Assessment and Plan.  Objective  Well appearing patient in no apparent distress; mood and affect are within normal limits.  A focused examination was performed including groin, left hip. Relevant physical exam findings are noted in the Assessment and Plan.  scrotum Erythema and lichenification at scrotum  low abdomen Scattered perifollicular inflammatory papules, resolving  left lateral hip 0.9 cm erythematous soft papule R/o lipofibroma vs other       Assessment & Plan  Rash scrotum  Improving.  Favor [facility] with possible underlying fungal infection (not enough scale for KOH at initial visit)  Repeat fluconazole 150 mg, take one tab po now and repeat 1 tab po in 1 week if still itchy.   D/c clobetasol  Start pimecrolimus twice daily as needed for itch.   Recommend OTC Gold Bond Rapid Relief Anti-Itch cream (pramoxine + menthol), CeraVe Anti-itch cream or lotion (pramoxine), Sarna lotion (Original- menthol + camphor or Sensitive- pramoxine) or Eucerin 12 hour Itch Relief lotion (menthol) up to 3 times per day to areas on body that are itchy.   pimecrolimus (ELIDEL) 1 % cream - scrotum Apply topically 2 (two) times daily. As needed for itch  Related Medications fluconazole (DIFLUCAN) 150 MG tablet Take 1 tablet by mouth now. Repeat in 1 week if still with  itch.  Folliculitis low abdomen  Recommend Hibiclens (chlorhexidine) or a benzoyl peroxide wash prior to shaving.   Neoplasm of uncertain behavior of skin left lateral hip  Epidermal / dermal shaving  Lesion diameter (cm):  0.9 Informed consent: discussed and consent obtained   Timeout: patient name, date of birth, surgical site, and procedure verified   Patient was prepped and draped in usual sterile fashion: area prepped with isopropyl alcohol. Anesthesia: the lesion was anesthetized in a standard fashion   Local anesthetic: 0.25% bupivicaine. Instrument used: flexible razor blade   Hemostasis achieved with: aluminum chloride   Outcome: patient tolerated procedure well   Post-procedure details: wound care instructions given   Additional details:  Mupirocin and a bandage applied  Specimen 1 - Surgical pathology Differential Diagnosis: R/o lipofibroma vs other  Check Margins: No 0.9 cm erythematous soft papule   Rash and other nonspecific skin eruption  Related Medications clobetasol ointment (TEMOVATE) 4.16 % Apply 1 application topically 2 (two) times daily. Use for up to 3 weeks at scrotum for rash. Avoid applying to face and axilla.   Return for TBSE, rash.  Graciella Belton, RMA, am acting as scribe for Forest Gleason, MD .  Documentation: I have reviewed the above documentation for accuracy and completeness, and I agree with the above.  Forest Gleason, MD

## 2021-05-05 NOTE — Patient Instructions (Addendum)
Repeat fluconazole 150 mg, take one tab po now and repeat 1 tab po in 1 week if still itchy.   Discontinue clobetasol.  Start pimecrolimus twice daily as needed for itch.   Recommend OTC Gold Bond Rapid Relief Anti-Itch cream (pramoxine + menthol), CeraVe Anti-itch cream or lotion (pramoxine), Sarna lotion (Original- menthol + camphor or Sensitive- pramoxine) or Eucerin 12 hour Itch Relief lotion (menthol) up to 3 times per day to areas on body that are itchy.  Side effects of fluconazole (diflucan) include nausea, diarrhea, headache, dizziness, taste changes, rare risk of irritation of the liver, allergy, or decreased blood counts (which could show up as infection or tiredness).   Recommend Hibaclens (chlorhexadine) or a benzoyl peroxide wash prior to shaving.   Wound Care Instructions  Cleanse wound gently with soap and water once a day then pat dry with clean gauze. Apply a thing coat of Petrolatum (petroleum jelly, "Vaseline") over the wound (unless you have an allergy to this). We recommend that you use a new, sterile tube of Vaseline. Do not pick or remove scabs. Do not remove the yellow or white "healing tissue" from the base of the wound.  Cover the wound with fresh, clean, nonstick gauze and secure with paper tape. You may use Band-Aids in place of gauze and tape if the would is small enough, but would recommend trimming much of the tape off as there is often too much. Sometimes Band-Aids can irritate the skin.  You should call the office for your biopsy report after 1 week if you have not already been contacted.  If you experience any problems, such as abnormal amounts of bleeding, swelling, significant bruising, significant pain, or evidence of infection, please call the office immediately.   If You Need Anything After Your Visit  If you have any questions or concerns for your doctor, please call our main line at [phone] and press option 4 to reach your doctor's medical  assistant. If no one answers, please leave a voicemail as directed and we will return your call as soon as possible. Messages left after 4 pm will be answered the following business day.   You may also send [place] a message via [place]. We typically respond to MyChart messages within 1-2 business days.  For prescription refills, please ask your pharmacy to contact our office. Our fax number is [phone].  If you have an urgent issue when the clinic is closed that cannot wait until the next business day, you can page your doctor at the number below.    Please note that while we do our best to be available for urgent issues outside of office hours, we are not available 24/7.   If you have an urgent issue and are unable to reach [place], you may choose to seek medical care at your doctor's office, retail clinic, urgent care center, or emergency room.  If you have a medical emergency, please immediately call 911 or go to the emergency department.  Pager Numbers  - Dr. Nehemiah Massed: [phone]  - Dr. Laurence Ferrari: [phone]  - Dr. Nicole Kindred: [phone]  In the event of inclement weather, please call our main line at [phone] for an update on the status of any delays or closures.  Dermatology Medication Tips: Please keep the boxes that topical medications come in in order to help keep track of the instructions about where and how to use these. Pharmacies typically print the medication instructions only on the boxes and not directly on the medication tubes.  If your medication is too expensive, please contact our office at [phone] option 4 or send [place] a message through [place].   We are unable to tell what your co-pay for medications will be in advance as this is different depending on your insurance coverage. However, we may be able to find a substitute medication at lower cost or fill out paperwork to get insurance to cover a needed medication.   If a prior authorization is required to get your  medication covered by your insurance company, please allow [place] 1-2 business days to complete this process.  Drug prices often vary depending on where the prescription is filled and some pharmacies may offer cheaper prices.  The website www.goodrx.com contains coupons for medications through different pharmacies. The prices here do not account for what the cost may be with help from insurance (it may be cheaper with your insurance), but the website can give you the price if you did not use any insurance.  - You can print the associated coupon and take it with your prescription to the pharmacy.  - You may also stop by our office during regular business hours and pick up a GoodRx coupon card.  - If you need your prescription sent electronically to a different pharmacy, notify our office through [facility] or by phone at [phone] option 4.     Si Usted Necesita Algo Despus de Su Visita  Tambin puede enviarnos un mensaje a travs de Pharmacist, community. Por lo general respondemos a los mensajes de MyChart en el transcurso de 1 a 2 das hbiles.  Para renovar recetas, por favor pida a su farmacia que se ponga en contacto con nuestra oficina. Harland Dingwall de fax es [place] [phone].  Si tiene un asunto urgente cuando la clnica est cerrada y que no puede esperar hasta el siguiente da hbil, puede llamar/localizar a su doctor(a) al nmero que aparece a continuacin.   Por favor, tenga en cuenta que aunque hacemos todo lo posible para estar disponibles para asuntos urgentes fuera del horario de [place], no estamos disponibles las 24 horas del da, los 7 das de la [place].   Si tiene un problema urgente y no puede comunicarse con nosotros, puede optar por buscar atencin mdica  en el consultorio de su doctor(a), en una clnica privada, en un centro de atencin urgente o en una sala de emergencias.  Si tiene Engineering geologist, por favor llame inmediatamente al 911 o vaya a la sala de  emergencias.  Nmeros de bper  - Dr. Nehemiah Massed: [phone]  - Dra. Moye: [phone]  - Dra. Nicole Kindred: [phone]  En caso de inclemencias del [place], por favor llame a Johnsie Kindred principal al [phone] para una actualizacin sobre el [place] de cualquier retraso o cierre.  Consejos para la medicacin en dermatologa: Por favor, guarde las cajas en las que vienen los medicamentos de uso tpico para ayudarle a seguir las instrucciones sobre dnde y cmo usarlos. Las farmacias generalmente imprimen las instrucciones del medicamento slo en las cajas y no directamente en los tubos del [place].   Si su medicamento es muy caro, por favor, pngase en contacto con Zigmund Daniel llamando al [phone] y presione la opcin 4 o envenos un mensaje a travs de Pharmacist, community.   No podemos decirle cul ser su copago por los medicamentos por adelantado ya que esto es diferente dependiendo de la cobertura de su seguro. Sin embargo, es posible que podamos encontrar un medicamento sustituto a Geneticist, molecular  formulario para que el seguro cubra el medicamento que se considera necesario.   Si se requiere una autorizacin previa para que su compaa de seguros [place] su medicamento, por favor permtanos de 1 a 2 das hbiles para completar este proceso.  Los precios de los medicamentos varan con frecuencia dependiendo del Environmental consultant de dnde se surte la receta y alguna farmacias pueden ofrecer precios ms baratos.  El sitio web www.goodrx.com tiene cupones para medicamentos de Airline pilot. Los precios aqu no tienen en cuenta lo que podra costar con la ayuda del seguro (puede ser ms barato con su seguro), pero el sitio web puede darle el precio si no utiliz Research scientist (physical sciences).  - Puede imprimir el cupn correspondiente y llevarlo con su receta a la farmacia.  - Tambin puede pasar por nuestra oficina durante el horario de atencin regular y Charity fundraiser una tarjeta de cupones de GoodRx.  - Si  necesita que su receta se enve electrnicamente a una farmacia diferente, informe a nuestra oficina a travs de MyChart de [place] o por telfono llamando al [phone] y presione la opcin 4.

## 2021-05-12 ENCOUNTER — Encounter: Payer: Self-pay | Admitting: Dermatology

## 2021-05-12 ENCOUNTER — Telehealth: Payer: Self-pay

## 2021-05-12 NOTE — Telephone Encounter (Signed)
Patient advised bx benign fatty growth.Thomas [name]

## 2021-05-12 NOTE — Telephone Encounter (Signed)
-----   Message from Alfonso Patten, MD sent at 05/12/2021  8:50 AM EST ----- Skin (M), left lateral hip NEVUS LIPOMATOSUS SUPERFICIALIS  Benign fatty growth, no treatment needed  MAs please call. Thank you!

## 2021-06-02 DIAGNOSIS — J111 Influenza due to unidentified influenza virus with other respiratory manifestations: Secondary | ICD-10-CM | POA: Diagnosis not present

## 2021-06-02 DIAGNOSIS — R509 Fever, unspecified: Secondary | ICD-10-CM | POA: Diagnosis not present

## 2021-06-02 DIAGNOSIS — Z03818 Encounter for observation for suspected exposure to other biological agents ruled out: Secondary | ICD-10-CM | POA: Diagnosis not present

## 2021-06-14 DIAGNOSIS — R0683 Snoring: Secondary | ICD-10-CM | POA: Diagnosis not present

## 2021-06-14 DIAGNOSIS — G4710 Hypersomnia, unspecified: Secondary | ICD-10-CM | POA: Diagnosis not present

## 2021-06-14 DIAGNOSIS — R0681 Apnea, not elsewhere classified: Secondary | ICD-10-CM | POA: Diagnosis not present

## 2021-06-15 ENCOUNTER — Ambulatory Visit: Payer: 59 | Admitting: Dermatology

## 2021-06-15 ENCOUNTER — Other Ambulatory Visit: Payer: Self-pay

## 2021-06-15 DIAGNOSIS — R21 Rash and other nonspecific skin eruption: Secondary | ICD-10-CM

## 2021-06-15 DIAGNOSIS — D0359 Melanoma in situ of other part of trunk: Secondary | ICD-10-CM | POA: Diagnosis not present

## 2021-06-15 DIAGNOSIS — D492 Neoplasm of unspecified behavior of bone, soft tissue, and skin: Secondary | ICD-10-CM

## 2021-06-15 DIAGNOSIS — L570 Actinic keratosis: Secondary | ICD-10-CM

## 2021-06-15 DIAGNOSIS — L814 Other melanin hyperpigmentation: Secondary | ICD-10-CM

## 2021-06-15 DIAGNOSIS — L821 Other seborrheic keratosis: Secondary | ICD-10-CM | POA: Diagnosis not present

## 2021-06-15 DIAGNOSIS — D039 Melanoma in situ, unspecified: Secondary | ICD-10-CM

## 2021-06-15 DIAGNOSIS — L820 Inflamed seborrheic keratosis: Secondary | ICD-10-CM

## 2021-06-15 DIAGNOSIS — D229 Melanocytic nevi, unspecified: Secondary | ICD-10-CM

## 2021-06-15 DIAGNOSIS — D225 Melanocytic nevi of trunk: Secondary | ICD-10-CM

## 2021-06-15 DIAGNOSIS — D1800 Hemangioma unspecified site: Secondary | ICD-10-CM

## 2021-06-15 DIAGNOSIS — D239 Other benign neoplasm of skin, unspecified: Secondary | ICD-10-CM

## 2021-06-15 DIAGNOSIS — Z1283 Encounter for screening for malignant neoplasm of skin: Secondary | ICD-10-CM | POA: Diagnosis not present

## 2021-06-15 HISTORY — DX: Melanoma in situ, unspecified: D03.9

## 2021-06-15 HISTORY — DX: Other benign neoplasm of skin, unspecified: D23.9

## 2021-06-15 NOTE — Progress Notes (Signed)
Follow-Up Visit   Subjective  Thomas [name] is a 56 y.o. male who presents for the following: Annual Exam (Mole check ) and Rash (6 weeks f/u rash in the groin, treated with Diflucan 150 mg and Elidel cream with a good response. ). The patient presents for Total-Body Skin Exam (TBSE) for skin cancer screening and mole check.  The patient has spots, moles and lesions to be evaluated, some may be new or changing and the patient has concerns that these could be cancer.   The following portions of the chart were reviewed this encounter and updated as appropriate:   Tobacco   Allergies   Meds   Problems   Med Hx   Surg Hx   Fam Hx       Review of Systems:  No other skin or systemic complaints except as noted in HPI or Assessment and Plan.  Objective  Well appearing patient in no apparent distress; mood and affect are within normal limits.  A full examination was performed including scalp, head, eyes, ears, nose, lips, neck, chest, axillae, abdomen, back, buttocks, bilateral upper extremities, bilateral lower extremities, hands, feet, fingers, toes, fingernails, and toenails. All findings within normal limits unless otherwise noted below.  scrotum Slight erythema left inguinal fold. Slight scale   left superior shoulder x 1 Erythematous thin papules/macules with gritty scale.   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 Condition is symptomatic / bothersome to patient. Not to goal.   Repeat diflucan 150 mg tablet once  Cont Pimecrolimus cream apply to affected  skin bid prn    Related Medications pimecrolimus (ELIDEL) 1 % cream Apply topically 2 (two) times daily. As needed for itch  AK (actinic keratosis) left superior shoulder x 1  Actinic keratoses are precancerous spots that appear secondary to cumulative UV radiation exposure/sun exposure over time. They are chronic with expected duration over 1 year. A portion of actinic keratoses will progress to squamous cell carcinoma of the skin. It is not possible to reliably predict which spots will progress to skin cancer and so treatment is recommended to prevent development of skin cancer.  Recommend daily broad spectrum sunscreen SPF 30+ to sun-exposed areas, reapply every 2 hours as needed.  Recommend staying in the shade or wearing long sleeves, sun glasses (UVA+UVB protection) and wide brim hats (4-inch brim around the entire circumference of the hat). Call for new or changing lesions.   Prior to procedure, discussed risks of blister formation, small wound, skin dyspigmentation, or rare scar following cryotherapy. Recommend Vaseline ointment to treated areas while healing.   Destruction of lesion - left superior shoulder x 1 Complexity: simple   Destruction method: cryotherapy   Informed consent: discussed and consent obtained   Timeout:  patient name, date of birth, surgical site, and procedure verified Lesion destroyed using liquid nitrogen: Yes   Region frozen until ice ball extended beyond lesion: Yes   Outcome: patient tolerated procedure well with no complications   Post-procedure details: wound care instructions given    Neoplasm of skin (3) mid  back right midline medial  Epidermal / dermal shaving  Lesion diameter (cm):  1 Informed consent: discussed and consent obtained   Timeout: patient name, date of birth, surgical site, and procedure verified   Procedure prep:  Patient was prepped and draped in usual sterile fashion Prep type:  Isopropyl alcohol Anesthesia: the lesion  was anesthetized in a standard fashion   Anesthetic:  1% lidocaine w/ epinephrine 1-100,000 buffered w/ 8.4% NaHCO3 Hemostasis achieved with: pressure, aluminum chloride and electrodesiccation   Outcome: patient tolerated procedure well   Post-procedure details: sterile dressing applied and wound care instructions given   Dressing type: bandage and petrolatum    Specimen 1 - Surgical pathology Differential Diagnosis: R/O Dysplastic nevus   Check Margins: No  mid back right of midline lateral  Epidermal / dermal shaving  Lesion diameter (cm):  0.3 Informed consent: discussed and consent obtained   Timeout: patient name, date of birth, surgical site, and procedure verified   Procedure prep:  Patient was prepped and draped in usual sterile fashion Prep type:  Isopropyl alcohol Anesthesia: the lesion was anesthetized in a standard fashion   Anesthetic:  1% lidocaine w/ epinephrine 1-100,000 buffered w/ 8.4% NaHCO3 Hemostasis achieved with: pressure, aluminum chloride and electrodesiccation   Outcome: patient tolerated procedure well   Post-procedure details: sterile dressing applied and wound care instructions given   Dressing type: bandage and petrolatum    Specimen 2 - Surgical pathology Differential Diagnosis: R/O Dysplastic nevus   Check Margins: No  Left Upper Arm - Posterior  Inflamed seborrheic keratosis left preauricular x 1  Symptomatic  Reassured benign age-related growth.    Prior to procedure, discussed risks of blister formation, small wound, skin dyspigmentation, or rare scar following cryotherapy. Recommend Vaseline ointment to treated areas while healing.   Destruction of lesion - left preauricular x 1 Complexity: simple   Destruction method: cryotherapy   Informed consent: discussed and consent obtained   Timeout:  patient name, date of birth, surgical site, and procedure verified Lesion destroyed using liquid nitrogen: Yes   Region frozen until ice ball  extended beyond lesion: Yes   Outcome: patient tolerated procedure well with no complications   Post-procedure details: wound care instructions given     Lentigines - Scattered tan macules - Due to sun exposure - Benign-appearing, observe - Recommend daily broad spectrum sunscreen SPF 30+ to sun-exposed areas, reapply every 2 hours as needed. - Call for any changes  Seborrheic Keratoses - Stuck-on, waxy, tan-brown papules and/or plaques  - Benign-appearing - Discussed benign etiology and prognosis. - Observe - Call for any changes  Melanocytic Nevi - Tan-brown and/or pink-flesh-colored symmetric macules and papules - Benign appearing on exam today - Observation - Call clinic for new or changing moles - Recommend daily use of broad spectrum spf 30+ sunscreen to sun-exposed areas.   Hemangiomas - Red papules - Discussed benign nature - Observe - Call for any changes   Skin cancer screening performed today.   Return in about 1 week (around 06/22/2021) for 1-6 weeks biopsies left axilla.  I, Marye Round, CMA, am acting as scribe for Forest Gleason, MD .   Documentation: I have reviewed the above documentation for accuracy and completeness, and I agree with the above.  Forest Gleason, MD

## 2021-06-15 NOTE — Patient Instructions (Addendum)
Wound Care Instructions  Cleanse wound gently with soap and water once a day then pat dry with clean gauze. Apply a thing coat of Petrolatum (petroleum jelly, "Vaseline") over the wound (unless you have an allergy to this). We recommend that you use a new, sterile tube of Vaseline. Do not pick or remove scabs. Do not remove the yellow or white "healing tissue" from the base of the wound.  Cover the wound with fresh, clean, nonstick gauze and secure with paper tape. You may use Band-Aids in place of gauze and tape if the would is small enough, but would recommend trimming much of the tape off as there is often too much. Sometimes Band-Aids can irritate the skin.  You should call the office for your biopsy report after 1 week if you have not already been contacted.  If you experience any problems, such as abnormal amounts of bleeding, swelling, significant bruising, significant pain, or evidence of infection, please call the office immediately.  FOR ADULT SURGERY PATIENTS: If you need something for pain relief you may take 1 extra strength Tylenol (acetaminophen) AND 2 Ibuprofen (200mg  each) together every 4 hours as needed for pain. (do not take these if you are allergic to them or if you have a reason you should not take them.) Typically, you may only need pain medication for 1 to 3 days.       Cryotherapy Aftercare  Wash gently with soap and water everyday.   Apply Vaseline and Band-Aid daily until healed.     Recommend taking Heliocare sun protection supplement daily in sunny weather for additional sun protection. For maximum protection on the sunniest days, you can take up to 2 capsules of regular Heliocare OR take 1 capsule of Heliocare Ultra. For prolonged exposure (such as a full day in the sun), you can repeat your dose of the supplement 4 hours after your first dose. Heliocare can be purchased at [employer], at some [facility] or at [email or web address].      If You Need  Anything After Your Visit  If you have any questions or concerns for your doctor, please call our main line at [phone] and press option 4 to reach your doctor's medical assistant. If no one answers, please leave a voicemail as directed and we will return your call as soon as possible. Messages left after 4 pm will be answered the following business day.   You may also send [place] a message via [place]. We typically respond to MyChart messages within 1-2 business days.  For prescription refills, please ask your pharmacy to contact our office. Our fax number is [phone].  If you have an urgent issue when the clinic is closed that cannot wait until the next business day, you can page your doctor at the number below.    Please note that while we do our best to be available for urgent issues outside of office hours, we are not available 24/7.   If you have an urgent issue and are unable to reach [place], you may choose to seek medical care at your doctor's office, retail clinic, urgent care center, or emergency room.  If you have a medical emergency, please immediately call 911 or go to the emergency department.  Pager Numbers  - Dr. Nehemiah Massed: [phone]  - Dr. Laurence Ferrari: [phone]  - Dr. Nicole Kindred: [phone]  In the event of inclement weather, please call our main line at [phone] for an update on the status of any delays or closures.  Dermatology Medication Tips: Please keep the boxes that topical medications come in in order to help keep track of the instructions about where and how to use these. Pharmacies typically print the medication instructions only on the boxes and not directly on the medication tubes.   If your medication is too expensive, please contact our office at [phone] option 4 or send [place] a message through [place].   We are unable to tell what your co-pay for medications will be in advance as this is different depending on your insurance coverage. However, we may  be able to find a substitute medication at lower cost or fill out paperwork to get insurance to cover a needed medication.   If a prior authorization is required to get your medication covered by your insurance company, please allow [place] 1-2 business days to complete this process.  Drug prices often vary depending on where the prescription is filled and some pharmacies may offer cheaper prices.  The website www.goodrx.com contains coupons for medications through different pharmacies. The prices here do not account for what the cost may be with help from insurance (it may be cheaper with your insurance), but the website can give you the price if you did not use any insurance.  - You can print the associated coupon and take it with your prescription to the pharmacy.  - You may also stop by our office during regular business hours and pick up a GoodRx coupon card.  - If you need your prescription sent electronically to a different pharmacy, notify our office through [facility] or by phone at [phone] option 4.     Si Usted Necesita Algo Despus de Su Visita  Tambin puede enviarnos un mensaje a travs de Pharmacist, community. Por lo general respondemos a los mensajes de MyChart en el transcurso de 1 a 2 das hbiles.  Para renovar recetas, por favor pida a su farmacia que se ponga en contacto con nuestra oficina. Harland Dingwall de fax es [place] [phone].  Si tiene un asunto urgente cuando la clnica est cerrada y que no puede esperar hasta el siguiente da hbil, puede llamar/localizar a su doctor(a) al nmero que aparece a continuacin.   Por favor, tenga en cuenta que aunque hacemos todo lo posible para estar disponibles para asuntos urgentes fuera del horario de [place], no estamos disponibles las 24 horas del da, los 7 das de la [place].   Si tiene un problema urgente y no puede comunicarse con nosotros, puede optar por buscar atencin mdica  en el consultorio de su doctor(a), en una  clnica privada, en un centro de atencin urgente o en una sala de emergencias.  Si tiene Engineering geologist, por favor llame inmediatamente al 911 o vaya a la sala de emergencias.  Nmeros de bper  - Dr. Nehemiah Massed: [phone]  - Dra. Moye: [phone]  - Dra. Nicole Kindred: [phone]  En caso de inclemencias del [place], por favor llame a Johnsie Kindred principal al [phone] para una actualizacin sobre el [place] de cualquier retraso o cierre.  Consejos para la medicacin en dermatologa: Por favor, guarde las cajas en las que vienen los medicamentos de uso tpico para ayudarle a seguir las instrucciones sobre dnde y cmo usarlos. Las farmacias generalmente imprimen las instrucciones del medicamento slo en las cajas y no directamente en los tubos del [place].   Si su medicamento es muy caro, por favor, pngase en contacto con Zigmund Daniel llamando al [phone] y presione la opcin 4 o envenos un  mensaje a travs de MyChart.   No podemos decirle cul ser su copago por los medicamentos por adelantado ya que esto es diferente dependiendo de la cobertura de su seguro. Sin embargo, es posible que podamos encontrar un medicamento sustituto a Electrical engineer un formulario para que el seguro cubra el medicamento que se considera necesario.   Si se requiere una autorizacin previa para que su compaa de seguros [place] su medicamento, por favor permtanos de 1 a 2 das hbiles para completar este proceso.  Los precios de los medicamentos varan con frecuencia dependiendo del Environmental consultant de dnde se surte la receta y alguna farmacias pueden ofrecer precios ms baratos.  El sitio web www.goodrx.com tiene cupones para medicamentos de Airline pilot. Los precios aqu no tienen en cuenta lo que podra costar con la ayuda del seguro (puede ser ms barato con su seguro), pero el sitio web puede darle el precio si no utiliz Research scientist (physical sciences).  - Puede imprimir el cupn correspondiente y  llevarlo con su receta a la farmacia.  - Tambin puede pasar por nuestra oficina durante el horario de atencin regular y Charity fundraiser una tarjeta de cupones de GoodRx.  - Si necesita que su receta se enve electrnicamente a una farmacia diferente, informe a nuestra oficina a travs de MyChart de [place] o por telfono llamando al [phone] y presione la opcin 4.

## 2021-06-19 DIAGNOSIS — G4733 Obstructive sleep apnea (adult) (pediatric): Secondary | ICD-10-CM | POA: Diagnosis not present

## 2021-06-21 ENCOUNTER — Encounter: Payer: Self-pay | Admitting: Dermatology

## 2021-06-28 DIAGNOSIS — I10 Essential (primary) hypertension: Secondary | ICD-10-CM | POA: Diagnosis not present

## 2021-06-28 DIAGNOSIS — R7303 Prediabetes: Secondary | ICD-10-CM | POA: Diagnosis not present

## 2021-06-28 DIAGNOSIS — E7800 Pure hypercholesterolemia, unspecified: Secondary | ICD-10-CM | POA: Diagnosis not present

## 2021-07-06 DIAGNOSIS — R69 Illness, unspecified: Secondary | ICD-10-CM | POA: Diagnosis not present

## 2021-07-06 DIAGNOSIS — I10 Essential (primary) hypertension: Secondary | ICD-10-CM | POA: Diagnosis not present

## 2021-07-06 DIAGNOSIS — E119 Type 2 diabetes mellitus without complications: Secondary | ICD-10-CM | POA: Diagnosis not present

## 2021-07-06 DIAGNOSIS — E7800 Pure hypercholesterolemia, unspecified: Secondary | ICD-10-CM | POA: Diagnosis not present

## 2021-07-07 ENCOUNTER — Ambulatory Visit: Payer: 59 | Admitting: Dermatology

## 2021-07-07 ENCOUNTER — Encounter: Payer: Self-pay | Admitting: Dermatology

## 2021-07-07 ENCOUNTER — Other Ambulatory Visit: Payer: Self-pay

## 2021-07-07 DIAGNOSIS — D0359 Melanoma in situ of other part of trunk: Secondary | ICD-10-CM

## 2021-07-07 DIAGNOSIS — D492 Neoplasm of unspecified behavior of bone, soft tissue, and skin: Secondary | ICD-10-CM

## 2021-07-07 DIAGNOSIS — L820 Inflamed seborrheic keratosis: Secondary | ICD-10-CM

## 2021-07-07 DIAGNOSIS — D225 Melanocytic nevi of trunk: Secondary | ICD-10-CM | POA: Diagnosis not present

## 2021-07-07 DIAGNOSIS — Z86018 Personal history of other benign neoplasm: Secondary | ICD-10-CM | POA: Diagnosis not present

## 2021-07-07 DIAGNOSIS — L578 Other skin changes due to chronic exposure to nonionizing radiation: Secondary | ICD-10-CM | POA: Diagnosis not present

## 2021-07-07 NOTE — Progress Notes (Signed)
? ?Follow-Up Visit ?  ?Subjective  ?Thomas [name] is a 56 y.o. male who presents for the following: Biopsy (Here for biopsies at left axilla).  These are moles that get irritated.  He was recently diagnosed with melanoma IS  on back and is scheduled for surgical excision next week. ? ?The patient has spots, moles and lesions to be evaluated, some may be new or changing and the patient has concerns that these could be cancer.  He has a new spot under his L eye that gets irritated. ? ? ?The following portions of the chart were reviewed this encounter and updated as appropriate:   ?  ? ?Review of Systems: No other skin or systemic complaints except as noted in HPI or Assessment and Plan. ? ? ?Objective  ?Well appearing patient in no apparent distress; mood and affect are within normal limits. ? ?A focused examination was performed including face, left axilla. Relevant physical exam findings are noted in the Assessment and Plan. ? ?Left Lower Axilla ?24m  fleshy papule ? ? ? ? ?Left Axilla ?758mfleshy papule  ? ? ? ? ?Left Infraocular x1 ?Erythematous keratotic or waxy stuck-on papule ? ?mid back right midline medial ?Ulcerated biopsy site with eschar ? ? ?Assessment & Plan  ?Neoplasm of skin (2) ?Left Lower Axilla ? ?Epidermal / dermal shaving ? ?Lesion diameter (cm):  0.8 ?Informed consent: discussed and consent obtained   ?Patient was prepped and draped in usual sterile fashion: Area prepped with alcohol. ?Anesthesia: the lesion was anesthetized in a standard fashion   ?Anesthetic:  1% lidocaine w/ epinephrine 1-100,000 buffered w/ 8.4% NaHCO3 ?Instrument used: flexible razor blade   ?Hemostasis achieved with: pressure, aluminum chloride and electrodesiccation   ?Outcome: patient tolerated procedure well   ?Post-procedure details: wound care instructions given   ?Post-procedure details comment:  Ointment and small bandage applied ? ?Specimen 1 - Surgical pathology ?Differential Diagnosis: R/O irritated nevus vs  irritated skin tag ? ?Check Margins: No ? ?Left Axilla ? ?Epidermal / dermal shaving ? ?Lesion diameter (cm):  0.7 ?Informed consent: discussed and consent obtained   ?Patient was prepped and draped in usual sterile fashion: Area prepped with alcohol. ?Anesthesia: the lesion was anesthetized in a standard fashion   ?Anesthetic:  1% lidocaine w/ epinephrine 1-100,000 buffered w/ 8.4% NaHCO3 ?Instrument used: flexible razor blade   ?Hemostasis achieved with: pressure, aluminum chloride and electrodesiccation   ?Outcome: patient tolerated procedure well   ?Post-procedure details: wound care instructions given   ?Post-procedure details comment:  Ointment and small bandage applied ? ?Specimen 2 - Surgical pathology ?Differential Diagnosis: R/O irritated nevus vs irritated skin tag ? ?Check Margins: No ? ?Inflamed seborrheic keratosis ?Left Infraocular x1 ? ?Destruction of lesion - Left Infraocular x1 ? ?Destruction method: cryotherapy   ?Informed consent: discussed and consent obtained   ?Lesion destroyed using liquid nitrogen: Yes   ?Region frozen until ice ball extended beyond lesion: Yes   ?Outcome: patient tolerated procedure well with no complications   ?Post-procedure details: wound care instructions given   ?Additional details:  Prior to procedure, discussed risks of blister formation, small wound, skin dyspigmentation, or rare scar following cryotherapy. Recommend Vaseline ointment to treated areas while healing.  ? ?[facility] right midline medial ? ?Bx-proven, discussed path result, prognosis, and treatment ?Pt is scheduled for WLE with Dr. StNicole Kindredext Wed at 1:30 pm ? ?History of Dysplastic Nevus ?- bx-proven mid back right of midline lateral, currently  healing ?- Recommend regular full body skin exams ?- Recommend daily broad spectrum sunscreen SPF 30+ to sun-exposed areas, reapply every 2 hours as needed.  ?- Call if any new or changing lesions are noted between office visits   ? ?Actinic Damage ?- chronic, secondary to cumulative UV radiation exposure/sun exposure over time ?- diffuse scaly erythematous macules with underlying dyspigmentation ?- Recommend daily broad spectrum sunscreen SPF 30+ to sun-exposed areas, reapply every 2 hours as needed.  ?- Recommend staying in the shade or wearing long sleeves, sun glasses (UVA+UVB protection) and wide brim hats (4-inch brim around the entire circumference of the hat). ?- Call for new or changing lesions.  ? ?Return for Surgery as scheduled. ? ?I, Emelia Salisbury, CMA, am acting as scribe for Brendolyn Patty, MD. ? ?Documentation: I have reviewed the above documentation for accuracy and completeness, and I agree with the above. ? ?Brendolyn Patty MD  ? ? ?

## 2021-07-07 NOTE — Patient Instructions (Signed)
Wound Care Instructions  Cleanse wound gently with soap and water once a day then pat dry with clean gauze. Apply a thing coat of Petrolatum (petroleum jelly, "Vaseline") over the wound (unless you have an allergy to this). We recommend that you use a new, sterile tube of Vaseline. Do not pick or remove scabs. Do not remove the yellow or white "healing tissue" from the base of the wound.  Cover the wound with fresh, clean, nonstick gauze and secure with paper tape. You may use Band-Aids in place of gauze and tape if the would is small enough, but would recommend trimming much of the tape off as there is often too much. Sometimes Band-Aids can irritate the skin.  You should call the office for your biopsy report after 1 week if you have not already been contacted.  If you experience any problems, such as abnormal amounts of bleeding, swelling, significant bruising, significant pain, or evidence of infection, please call the office immediately.  FOR ADULT SURGERY PATIENTS: If you need something for pain relief you may take 1 extra strength Tylenol (acetaminophen) AND 2 Ibuprofen ('200mg'$  each) together every 4 hours as needed for pain. (do not take these if you are allergic to them or if you have a reason you should not take them.) Typically, you may only need pain medication for 1 to 3 days.      Pre-Operative Instructions  You are scheduled for a surgical procedure at [facility]. We recommend you read the following instructions. If you have any questions or concerns, please call the office at [phone].  Shower and wash the entire body with soap and water the day of your surgery paying special attention to cleansing at and around the planned surgery site.  Avoid aspirin or aspirin containing products at least fourteen (14) days prior to your surgical procedure and for at least one week (7 Days) after your surgical procedure. If you take aspirin on a regular basis for heart disease or  history of stroke or for any other reason, we may recommend you continue taking aspirin but please notify us if you take this on a regular basis. Aspirin can cause more bleeding to occur during surgery as well as prolonged bleeding and bruising after surgery.   Avoid other nonsteroidal pain medications at least one week prior to surgery and at least one week prior to your surgery. These include medications such as Ibuprofen (Motrin, Advil and Nuprin), Naprosyn, Voltaren, Relafen, etc. If medications are used for therapeutic reasons, please inform us as they can cause increased bleeding or prolonged bleeding during and bruising after surgical procedures.   Please advise [place] if you are taking any "blood thinner" medications such as Coumadin or Dipyridamole or Plavix or similar medications. These cause increased bleeding and prolonged bleeding during procedures and bruising after surgical procedures. We may have to consider discontinuing these medications briefly prior to and shortly after your surgery if safe to do so.   Please inform us of all medications you are currently taking. All medications that are taken regularly should be taken the day of surgery as you always do. Nevertheless, we need to be informed of what medications you are taking prior to surgery to know whether they will affect the procedure or cause any complications.   Please inform us of any medication allergies. Also inform us of whether you have allergies to Latex or rubber products or whether you have had any adverse reaction to Lidocaine or Epinephrine.  Please  inform us of any prosthetic or artificial body parts such as artificial heart valve, joint replacements, etc., or similar condition that might require preoperative antibiotics.   We recommend avoidance of alcohol at least two weeks prior to surgery and continued avoidance for at least two weeks after surgery.   We recommend discontinuation of tobacco smoking at least two  weeks prior to surgery and continued abstinence for at least two weeks after surgery.  Do not plan strenuous exercise, strenuous work or strenuous lifting for approximately four weeks after your surgery.   We request if you are unable to make your scheduled surgical appointment, please call us at least a week in advance or as soon as you are aware of a problem so that we can cancel or reschedule the appointment.   You MAY TAKE TYLENOL (acetaminophen) for pain as it is not a blood thinner.   PLEASE PLAN TO BE IN TOWN FOR TWO WEEKS FOLLOWING SURGERY, THIS IS IMPORTANT SO YOU CAN BE CHECKED FOR DRESSING CHANGES, SUTURE REMOVAL AND TO MONITOR FOR POSSIBLE COMPLICATIONS.  If You Need Anything After Your Visit  If you have any questions or concerns for your doctor, please call our main line at [phone] and press option 4 to reach your doctor's medical assistant. If no one answers, please leave a voicemail as directed and we will return your call as soon as possible. Messages left after 4 pm will be answered the following business day.   You may also send [place] a message via [place]. We typically respond to MyChart messages within 1-2 business days.  For prescription refills, please ask your pharmacy to contact our office. Our fax number is [phone].  If you have an urgent issue when the clinic is closed that cannot wait until the next business day, you can page your doctor at the number below.    Please note that while we do our best to be available for urgent issues outside of office hours, we are not available 24/7.   If you have an urgent issue and are unable to reach [place], you may choose to seek medical care at your doctor's office, retail clinic, urgent care center, or emergency room.  If you have a medical emergency, please immediately call 911 or go to the emergency department.  Pager Numbers  - Dr. Nehemiah Massed: [phone]  - Dr. Laurence Ferrari: [phone]  - Dr. Nicole Kindred: [phone]  In  the event of inclement weather, please call our main line at [phone] for an update on the status of any delays or closures.  Dermatology Medication Tips: Please keep the boxes that topical medications come in in order to help keep track of the instructions about where and how to use these. Pharmacies typically print the medication instructions only on the boxes and not directly on the medication tubes.   If your medication is too expensive, please contact our office at [phone] option 4 or send [place] a message through [place].   We are unable to tell what your co-pay for medications will be in advance as this is different depending on your insurance coverage. However, we may be able to find a substitute medication at lower cost or fill out paperwork to get insurance to cover a needed medication.   If a prior authorization is required to get your medication covered by your insurance company, please allow [place] 1-2 business days to complete this process.  Drug prices often vary depending on where the prescription is filled and some pharmacies may  offer cheaper prices.  The website www.goodrx.com contains coupons for medications through different pharmacies. The prices here do not account for what the cost may be with help from insurance (it may be cheaper with your insurance), but the website can give you the price if you did not use any insurance.  - You can print the associated coupon and take it with your prescription to the pharmacy.  - You may also stop by our office during regular business hours and pick up a GoodRx coupon card.  - If you need your prescription sent electronically to a different pharmacy, notify our office through [facility] or by phone at [phone] option 4.     Si Usted Necesita Algo Despus de Su Visita  Tambin puede enviarnos un mensaje a travs de Pharmacist, community. Por lo general respondemos a los mensajes de MyChart en el transcurso de 1 a 2 das  hbiles.  Para renovar recetas, por favor pida a su farmacia que se ponga en contacto con nuestra oficina. Harland Dingwall de fax es [place] [phone].  Si tiene un asunto urgente cuando la clnica est cerrada y que no puede esperar hasta el siguiente da hbil, puede llamar/localizar a su doctor(a) al nmero que aparece a continuacin.   Por favor, tenga en cuenta que aunque hacemos todo lo posible para estar disponibles para asuntos urgentes fuera del horario de [place], no estamos disponibles las 24 horas del da, los 7 das de la [place].   Si tiene un problema urgente y no puede comunicarse con nosotros, puede optar por buscar atencin mdica  en el consultorio de su doctor(a), en una clnica privada, en un centro de atencin urgente o en una sala de emergencias.  Si tiene Engineering geologist, por favor llame inmediatamente al 911 o vaya a la sala de emergencias.  Nmeros de bper  - Dr. Nehemiah Massed: [phone]  - Dra. Moye: [phone]  - Dra. Nicole Kindred: [phone]  En caso de inclemencias del [place], por favor llame a Johnsie Kindred principal al [phone] para una actualizacin sobre el [place] de cualquier retraso o cierre.  Consejos para la medicacin en dermatologa: Por favor, guarde las cajas en las que vienen los medicamentos de uso tpico para ayudarle a seguir las instrucciones sobre dnde y cmo usarlos. Las farmacias generalmente imprimen las instrucciones del medicamento slo en las cajas y no directamente en los tubos del [place].   Si su medicamento es muy caro, por favor, pngase en contacto con Zigmund Daniel llamando al [phone] y presione la opcin 4 o envenos un mensaje a travs de Pharmacist, community.   No podemos decirle cul ser su copago por los medicamentos por adelantado ya que esto es diferente dependiendo de la cobertura de su seguro. Sin embargo, es posible que podamos encontrar un medicamento sustituto a Electrical engineer un formulario para que el  seguro cubra el medicamento que se considera necesario.   Si se requiere una autorizacin previa para que su compaa de seguros [place] su medicamento, por favor permtanos de 1 a 2 das hbiles para completar este proceso.  Los precios de los medicamentos varan con frecuencia dependiendo del Environmental consultant de dnde se surte la receta y alguna farmacias pueden ofrecer precios ms baratos.  El sitio web www.goodrx.com tiene cupones para medicamentos de Airline pilot. Los precios aqu no tienen en cuenta lo que podra costar con la ayuda del seguro (puede ser ms barato con su seguro), pero el sitio web puede darle el precio si no Copy  ningn seguro.  - Puede imprimir el cupn correspondiente y llevarlo con su receta a la farmacia.  - Tambin puede pasar por nuestra oficina durante el horario de atencin regular y Charity fundraiser una tarjeta de cupones de GoodRx.  - Si necesita que su receta se enve electrnicamente a una farmacia diferente, informe a nuestra oficina a travs de MyChart de [place] o por telfono llamando al [phone] y presione la opcin 4.

## 2021-07-09 DIAGNOSIS — G4733 Obstructive sleep apnea (adult) (pediatric): Secondary | ICD-10-CM | POA: Diagnosis not present

## 2021-07-12 ENCOUNTER — Telehealth: Payer: Self-pay

## 2021-07-12 ENCOUNTER — Other Ambulatory Visit: Payer: Self-pay

## 2021-07-12 DIAGNOSIS — Z87891 Personal history of nicotine dependence: Secondary | ICD-10-CM

## 2021-07-12 NOTE — Telephone Encounter (Signed)
-----   Message from Alfonso Patten, MD sent at 07/09/2021  1:31 PM EST ----- ?1. Skin , left lower axilla ?MELANOCYTIC NEVUS, INTRADERMAL TYPE, IRRITATED ?This is a NORMAL MOLE. No additional treatment is needed. If you notice any new or changing spots or have other skin concerns in future, please call our office at [phone].   ? ?2. Skin , left axilla ?MELANOCYTIC NEVUS, INTRADERMAL TYPE, IRRITATED ?This is a NORMAL MOLE. No additional treatment is needed. If you notice any new or changing spots or have other skin concerns in future, please call our office at [phone].   ? ?MAs please call. Thank you! ?

## 2021-07-12 NOTE — Telephone Encounter (Signed)
Biopsy results discussed with pt  

## 2021-07-13 ENCOUNTER — Encounter: Payer: 59 | Admitting: Dermatology

## 2021-07-14 ENCOUNTER — Encounter: Payer: Self-pay | Admitting: Dermatology

## 2021-07-14 ENCOUNTER — Other Ambulatory Visit: Payer: Self-pay | Admitting: Dermatology

## 2021-07-14 ENCOUNTER — Ambulatory Visit (INDEPENDENT_AMBULATORY_CARE_PROVIDER_SITE_OTHER): Payer: 59 | Admitting: Dermatology

## 2021-07-14 ENCOUNTER — Other Ambulatory Visit: Payer: Self-pay

## 2021-07-14 DIAGNOSIS — D0359 Melanoma in situ of other part of trunk: Secondary | ICD-10-CM

## 2021-07-14 MED ORDER — MUPIROCIN 2 % EX OINT: TOPICAL_OINTMENT | CUTANEOUS | 1 refills | Status: AC

## 2021-07-14 MED ORDER — DOXYCYCLINE HYCLATE 100 MG PO CAPS: 100.0000 mg | ORAL_CAPSULE | Freq: Two times a day (BID) | ORAL | 0 refills | Status: AC

## 2021-07-14 NOTE — Patient Instructions (Addendum)
Doxycycline should be taken with food to prevent nausea. Do not lay down for 30 minutes after taking. Be cautious with sun exposure and use good sun protection while on this medication. Pregnant women should not take this medication.  ? ? ?Wound Care Instructions ? ?Cleanse wound gently with soap and water once a day then pat dry with clean gauze. Apply a thing coat of Petrolatum (petroleum jelly, "Vaseline") over the wound (unless you have an allergy to this). We recommend that you use a new, sterile tube of Vaseline. Do not pick or remove scabs. Do not remove the yellow or white "healing tissue" from the base of the wound. ? ?Cover the wound with fresh, clean, nonstick gauze and secure with paper tape. You may use Band-Aids in place of gauze and tape if the would is small enough, but would recommend trimming much of the tape off as there is often too much. Sometimes Band-Aids can irritate the skin. ? ?You should call the office for your biopsy report after 1 week if you have not already been contacted. ? ?If you experience any problems, such as abnormal amounts of bleeding, swelling, significant bruising, significant pain, or evidence of infection, please call the office immediately. ? ?FOR ADULT SURGERY PATIENTS: If you need something for pain relief you may take 1 extra strength Tylenol (acetaminophen) AND 2 Ibuprofen ('200mg'$  each) together every 4 hours as needed for pain. (do not take these if you are allergic to them or if you have a reason you should not take them.) Typically, you may only need pain medication for 1 to 3 days.  ? ? ?Start Doxycycline '100mg'$  one capsule twice daily with food for 7 days. ? ?Start Mupirocin, apply once daily with bandage change.  ? ? ? ?If You Need Anything After Your Visit ? ?If you have any questions or concerns for your doctor, please call our main line at [phone] and press option 4 to reach your doctor's medical assistant. If no one answers, please leave a voicemail as  directed and we will return your call as soon as possible. Messages left after 4 pm will be answered the following business day.  ? ?You may also send [place] a message via MyChart. We typically respond to MyChart messages within 1-2 business days. ? ?For prescription refills, please ask your pharmacy to contact our office. Our fax number is [phone]. ? ?If you have an urgent issue when the clinic is closed that cannot wait until the next business day, you can page your doctor at the number below.   ? ?Please note that while we do our best to be available for urgent issues outside of office hours, we are not available 24/7.  ? ?If you have an urgent issue and are unable to reach [place], you may choose to seek medical care at your doctor's office, retail clinic, urgent care center, or emergency room. ? ?If you have a medical emergency, please immediately call 911 or go to the emergency department. ? ?Pager Numbers ? ?- Dr. Nehemiah Massed: [phone] ? ?- Dr. Laurence Ferrari: [phone] ? ?- Dr. Nicole Kindred: [phone] ? ?In the event of inclement weather, please call our main line at [phone] for an update on the status of any delays or closures. ? ?Dermatology Medication Tips: ?Please keep the boxes that topical medications come in in order to help keep track of the instructions about where and how to use these. Pharmacies typically print the medication instructions only on the boxes and not directly  on the medication tubes.  ? ?If your medication is too expensive, please contact our office at [phone] option 4 or send [place] a message through [place].  ? ?We are unable to tell what your co-pay for medications will be in advance as this is different depending on your insurance coverage. However, we may be able to find a substitute medication at lower cost or fill out paperwork to get insurance to cover a needed medication.  ? ?If a prior authorization is required to get your medication covered by your insurance company, please  allow [place] 1-2 business days to complete this process. ? ?Drug prices often vary depending on where the prescription is filled and some pharmacies may offer cheaper prices. ? ?The website www.goodrx.com contains coupons for medications through different pharmacies. The prices here do not account for what the cost may be with help from insurance (it may be cheaper with your insurance), but the website can give you the price if you did not use any insurance.  ?- You can print the associated coupon and take it with your prescription to the pharmacy.  ?- You may also stop by our office during regular business hours and pick up a GoodRx coupon card.  ?- If you need your prescription sent electronically to a different pharmacy, notify our office through [facility] or by phone at [phone] option 4. ? ? ? ? ?Si Usted Necesita Algo Despu?s de Su Visita ? ?Tambi?n puede enviarnos un mensaje a trav?s de MyChart. Por lo general respondemos a los mensajes de MyChart en el transcurso de 1 a 2 d?as h?biles. ? ?Para renovar recetas, por favor pida a su farmacia que se ponga en contacto con nuestra oficina. Nuestro n?mero de fax es el [phone]. ? ?Si tiene un asunto urgente cuando la cl?nica est? cerrada y que no puede esperar hasta el siguiente d?a h?bil, puede llamar/localizar a su doctor(a) al n?mero que aparece a continuaci?n.  ? ?Por favor, tenga en cuenta que aunque hacemos todo lo posible para estar disponibles para asuntos urgentes fuera del horario de oficina, no estamos disponibles las 24 horas del d?a, los 7 d?as de la semana.  ? ?Si tiene un problema urgente y no puede comunicarse con nosotros, puede optar por buscar atenci?n m?dica  en el consultorio de su doctor(a), en una cl?nica privada, en un centro de atenci?n urgente o en una sala de emergencias. ? ?Si tiene Engineer, maintenance (IT) m?dica, por favor llame inmediatamente al 911 o vaya a la sala de emergencias. ? ?N?meros de b?per ? ?- Dr. Nehemiah Massed:  [phone] ? ?- Dra. Moye: [phone] ? ?- Dra. Nicole Kindred: [phone] ? ?En caso de inclemencias del tiempo, por favor llame a nuestra l?nea principal al [phone] para una actualizaci?n sobre el estado de cualquier retraso o cierre. ? ?Consejos para la medicaci?n en dermatolog?a: ?Por favor, guarde las cajas en las que vienen los medicamentos de uso t?pico para ayudarle a seguir las instrucciones sobre d?nde y c?mo usarlos. Las farmacias generalmente imprimen las instrucciones del medicamento s?lo en las cajas y no directamente en los tubos del [place].  ? ?Si su medicamento es muy caro, por favor, p?ngase en contacto con Zigmund Daniel llamando al [phone] y presione la opci?n 4 o env?enos un mensaje a trav?s de MyChart.  ? ?No podemos decirle cu?l ser? su copago por los medicamentos por adelantado ya que esto es diferente dependiendo de la cobertura de su seguro. Sin embargo, es posible que podamos encontrar un medicamento sustituto  a Electrical engineer un formulario para que el seguro cubra el medicamento que se considera necesario.  ? ?Si se requiere Ardelia Mems autorizaci?n previa para que su compa??a de seguros [place] su medicamento, por favor perm?tanos de 1 a 2 d?as h?biles para completar este proceso. ? ?Los precios de los medicamentos var?an con frecuencia dependiendo del Environmental consultant de d?nde se surte la receta y alguna farmacias pueden ofrecer precios m?s baratos. ? ?El sitio web www.goodrx.com tiene cupones para medicamentos de Airline pilot. Los precios aqu? no tienen en cuenta lo que podr?a costar con la ayuda del seguro (puede ser m?s barato con su seguro), pero el sitio web puede darle el precio si no utiliz? ning?n seguro.  ?- Puede imprimir el cup?n correspondiente y llevarlo con su receta a la farmacia.  ?- Tambi?n puede pasar por nuestra oficina durante el horario de atenci?n regular y recoger una tarjeta de cupones de GoodRx.  ?- Si necesita que su receta se env?e electr?nicamente a  Chiropodist, informe a nuestra oficina a trav?s de MyChart de [place] o por tel?fono llamando al [phone] y presione la opci?n 4. ? ?

## 2021-07-14 NOTE — Progress Notes (Signed)
? ?Follow-Up Visit ?  ?Subjective  ?Thomas [name] is a 56 y.o. 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 Patient presents for excision. ). ? ? ?The following portions of the chart were reviewed this encounter and updated as appropriate:  ?  ?  ? ?Review of Systems:  No other skin or systemic complaints except as noted in HPI or Assessment and Plan. ? ?Objective  ?Well appearing patient in no apparent distress; mood and affect are within normal limits. ? ?A focused examination was performed including back. Relevant physical exam findings are noted in the Assessment and Plan. ? ?mid back right midline medial ?Pink crusted biopsy site, 1.1cm ? ? ? ?Assessment & Plan  ?Melanoma in situ of other part of trunk ([place]) ?mid back right midline medial ? ?Skin excision ? ?Lesion length (cm):  1.1 ?Lesion width (cm):  1.1 ?Margin per side (cm):  0.5 ?Total excision diameter (cm):  2.1 ?Informed consent: discussed and consent obtained   ?Timeout: patient name, date of birth, surgical site, and procedure verified   ?Procedure prep:  Patient was prepped and draped in usual sterile fashion ?Prep type:  Povidone-iodine ?Anesthesia: the lesion was anesthetized in a standard fashion   ?Anesthesia comment:  Total 18cc - 9cc lido w/epi, 9cc bupivicaine ?Anesthetic:  1% lidocaine w/ epinephrine 1-100,000 buffered w/ 8.4% NaHCO3 (0.5% bupivicaine) ?Instrument used: #15 blade   ?Hemostasis achieved with: pressure and electrodesiccation   ?Outcome: patient tolerated procedure well with no complications   ?Additional details:  Tag at 12 O'clock superior  ? ?Skin repair ?Complexity:  Complex ?Final length (cm):  5.2 ?Informed consent: discussed and consent obtained   ?Timeout: patient name, date of birth, surgical site, and procedure verified   ?Reason for type of repair: reduce tension to allow closure, reduce the risk of dehiscence, infection, and necrosis, reduce  subcutaneous dead space and avoid a hematoma, preserve normal anatomical and functional relationships and enhance both functionality and cosmetic results   ?Undermining: area extensively undermined   ?Undermining comment:  2.1 cm ?Subcutaneous layers (deep stitches):  ?Suture size:  2-0 and 3-0 ?Suture type: Vicryl (polyglactin 910)   ?Stitches:  Buried vertical mattress ?Fine/surface layer approximation (top stitches):  ?Suture size:  3-0 ?Suture type: nylon   ?Stitches: vertical mattress and simple interrupted   ?Suture removal (days):  7 ?Hemostasis achieved with: suture and pressure ?Outcome: patient tolerated procedure well with no complications   ?Post-procedure details: sterile dressing applied and wound care instructions given   ?Dressing type: pressure dressing (mupirocin)   ? ?doxycycline (VIBRAMYCIN) 100 MG capsule ?Take 1 capsule (100 mg total) by mouth 2 (two) times daily for 7 days. Take with food ? ?mupirocin ointment (BACTROBAN) 2 % ?Apply once daily with bandage change ? ?Specimen 1 - Surgical pathology ?Differential Diagnosis: Melanoma in situ arising in dysplastic nevus ?Check Margins: Yes ?Pink biopsy site ?[id number] ?Tag at 12 O'clock superior  ? ?Start Doxycycline '100mg'$  one capsule twice daily with food for 7 days. ? ?Start Mupirocin, apply once daily with bandage change.  ? ?Doxycycline should be taken with food to prevent nausea. Do not lay down for 30 minutes after taking. Be cautious with sun exposure and use good sun protection while on this medication. Pregnant women should not take this medication.  ? ? ? ?Return in about 1 week (around 07/21/2021) for suture removal. Also 3 mo f/u with Dr Chauncey Cruel or Dr Jerilynn Mages  for TBSE. ? ?I, Jamesetta Orleans, CMA, am acting as scribe for Brendolyn Patty, MD . ? ?Documentation: I have reviewed the above documentation for accuracy and completeness, and I agree with the above. ? ?Brendolyn Patty MD  ? ?

## 2021-07-15 ENCOUNTER — Telehealth: Payer: Self-pay

## 2021-07-15 MED ORDER — HYDROXYZINE HCL 25 MG PO TABS: 25.0000 mg | ORAL_TABLET | ORAL | 0 refills | Status: AC

## 2021-07-15 NOTE — Telephone Encounter (Signed)
Patient was here yesterday for melanoma surgery. Patient came in the office to have photos taken of hives outbreak going on that start last night. Patient is unsure if this started before or after taking Doxycycline. Patient thinks it was after. He did take a benadryl last night to help with the itching and to help him rest. He has been advised to d/c Doxycycline and take a Zyrtec at this time per Dr. Nicole Kindred until she reviews photos. Patient also had this rash back when he had [place]. He did previously take Doxycycline from PCP back in July with no issues or concerns.  ? ?Photos were taken and placed in media.  ?

## 2021-07-15 NOTE — Telephone Encounter (Signed)
Patient has been advised of all information per Dr. Nicole Kindred and Hydroxyzine RX was sent in. Patient appreciates our time and help.  ?

## 2021-07-16 DIAGNOSIS — R21 Rash and other nonspecific skin eruption: Secondary | ICD-10-CM | POA: Diagnosis not present

## 2021-07-16 DIAGNOSIS — E119 Type 2 diabetes mellitus without complications: Secondary | ICD-10-CM | POA: Diagnosis not present

## 2021-07-16 DIAGNOSIS — I10 Essential (primary) hypertension: Secondary | ICD-10-CM | POA: Diagnosis not present

## 2021-07-21 ENCOUNTER — Other Ambulatory Visit: Payer: Self-pay

## 2021-07-21 ENCOUNTER — Ambulatory Visit (INDEPENDENT_AMBULATORY_CARE_PROVIDER_SITE_OTHER): Payer: 59 | Admitting: Dermatology

## 2021-07-21 DIAGNOSIS — R21 Rash and other nonspecific skin eruption: Secondary | ICD-10-CM

## 2021-07-21 DIAGNOSIS — L518 Other erythema multiforme: Secondary | ICD-10-CM

## 2021-07-21 DIAGNOSIS — T364X5A Adverse effect of tetracyclines, initial encounter: Secondary | ICD-10-CM

## 2021-07-21 MED ORDER — TRIAMCINOLONE ACETONIDE 0.1 % EX CREA: 1.0000 "application " | TOPICAL_CREAM | Freq: Two times a day (BID) | CUTANEOUS | 1 refills | Status: DC | PRN

## 2021-07-21 NOTE — Patient Instructions (Signed)

## 2021-07-21 NOTE — Progress Notes (Signed)
? ?  Follow-Up Visit ?  ?Subjective  ?Thomas [name] is a 56 y.o. male who presents for the following: Follow-up (Patient here today for suture removal and excision follow up for bx proven melanoma in situ at mid back right midline medial. Excision was last Wednesday and that evening he started developing hives at face and upper trunk. Patient came back to office on Thursday and was taken off of doxycycline and put on hydroxyzine for itch. ). ?Patient advises he went to PCP on Friday because he was not getting any better and they told him it was hives too.  ?Today patient has multiple sores at upper body that he said are sore and itchy. Patient said they come up like blisters and that they burn when they drain.  ? ?The following portions of the chart were reviewed this encounter and updated as appropriate:  ? Tobacco  Allergies  Meds  Problems  Med Hx  Surg Hx  Fam Hx   ?  ?Review of Systems:  No other skin or systemic complaints except as noted in HPI or Assessment and Plan. ? ?Objective  ?Well appearing patient in no apparent distress; mood and affect are within normal limits. ? ?A focused examination was performed including face, neck, chest and back. Relevant physical exam findings are noted in the Assessment and Plan. ? ?upper body, arms ?Oval and round crusted patches at trunk ? ? ? ? ? ? ? ? ? ? ? ?Assessment & Plan  ?Rash -erythema multiforme minor secondary to doxycycline treatment -now discontinued ?upper body, arms ? ?Favor Erythema multiforme minor secondary to doxycycline ?Doxycycline has already been discontinued. ? ?Continue hydroxyzine as needed ? ?Start TMC 0.1% cream 1-2 times daily as needed for rash. Avoid applying to face, groin, and axilla. Use as directed. Long-term use can cause thinning of the skin. ? ?Topical steroids (such as triamcinolone, fluocinolone, fluocinonide, mometasone, clobetasol, halobetasol, betamethasone, hydrocortisone) can cause thinning and lightening of the skin  if they are used for too long in the same area. Your physician has selected the right strength medicine for your problem and area affected on the body. Please use your medication only as directed by your physician to prevent side effects.  ? ?triamcinolone cream (KENALOG) 0.1 % - upper body, arms ?Apply 1 application. topically 2 (two) times daily as needed. Avoid applying to face, groin, and axilla. Use as directed. Long-term use can cause thinning of the skin. ? ?Encounter for Removal of Sutures ?- Incision site at the mid back right midline medial is clean, dry and intact ?- Wound cleansed, sutures removed, wound cleansed and steri strips applied.  ?- Discussed pathology results showing margins free  ?- Patient advised to keep steri-strips dry until they fall off. ?- Scars remodel for a full year. ?- Once steri-strips fall off, patient can apply over-the-counter silicone scar cream each night to help with scar remodeling if desired. ?- Patient advised to call with any concerns or if they notice any new or changing lesions. ? ?Return for TBSE, as scheduled. ?Also return in 1 to 2 weeks if rash not improving. ? ?Graciella Belton, RMA, am acting as scribe for Sarina Ser, MD . ?Documentation: I have reviewed the above documentation for accuracy and completeness, and I agree with the above. ? ?Sarina Ser, MD ? ?

## 2021-07-25 ENCOUNTER — Encounter: Payer: Self-pay | Admitting: Dermatology

## 2021-07-28 ENCOUNTER — Other Ambulatory Visit: Payer: Self-pay

## 2021-07-28 ENCOUNTER — Ambulatory Visit
Admission: RE | Admit: 2021-07-28 | Discharge: 2021-07-28 | Disposition: A | Discharge status: Home / Self Care | Payer: 59 | Source: Ambulatory Visit | Attending: Acute Care | Admitting: Acute Care

## 2021-07-28 DIAGNOSIS — Z87891 Personal history of nicotine dependence: Secondary | ICD-10-CM | POA: Diagnosis not present

## 2021-07-28 DIAGNOSIS — R918 Other nonspecific abnormal finding of lung field: Secondary | ICD-10-CM | POA: Diagnosis not present

## 2021-07-28 DIAGNOSIS — J439 Emphysema, unspecified: Secondary | ICD-10-CM | POA: Diagnosis not present

## 2021-07-29 ENCOUNTER — Other Ambulatory Visit: Payer: Self-pay | Admitting: Acute Care

## 2021-07-29 DIAGNOSIS — Z87891 Personal history of nicotine dependence: Secondary | ICD-10-CM

## 2021-08-11 ENCOUNTER — Encounter: Payer: Self-pay | Admitting: Dermatology

## 2021-09-14 ENCOUNTER — Ambulatory Visit: Payer: 59 | Admitting: Dermatology

## 2021-09-14 ENCOUNTER — Encounter: Payer: Self-pay | Admitting: Dermatology

## 2021-09-14 DIAGNOSIS — L249 Irritant contact dermatitis, unspecified cause: Secondary | ICD-10-CM

## 2021-09-14 DIAGNOSIS — Z86006 Personal history of melanoma in-situ: Secondary | ICD-10-CM | POA: Diagnosis not present

## 2021-09-14 MED ORDER — MOMETASONE FUROATE 0.1 % EX CREA: TOPICAL_CREAM | CUTANEOUS | 0 refills | Status: AC

## 2021-09-14 NOTE — Patient Instructions (Addendum)
Start Mometasone cream twice daily for no more than 2 weeks. ? ?Call in 2 weeks if not resolved.  ? ?Discontinue Neosporin. ? ? ?If You Need Anything After Your Visit ? ?If you have any questions or concerns for your doctor, please call our main line at [phone] and press option 4 to reach your doctor's medical assistant. If no one answers, please leave a voicemail as directed and we will return your call as soon as possible. Messages left after 4 pm will be answered the following business day.  ? ?You may also send [place] a message via MyChart. We typically respond to MyChart messages within 1-2 business days. ? ?For prescription refills, please ask your pharmacy to contact our office. Our fax number is [phone]. ? ?If you have an urgent issue when the clinic is closed that cannot wait until the next business day, you can page your doctor at the number below.   ? ?Please note that while we do our best to be available for urgent issues outside of office hours, we are not available 24/7.  ? ?If you have an urgent issue and are unable to reach [place], you may choose to seek medical care at your doctor's office, retail clinic, urgent care center, or emergency room. ? ?If you have a medical emergency, please immediately call 911 or go to the emergency department. ? ?Pager Numbers ? ?- Dr. Nehemiah Massed: [phone] ? ?- Dr. Laurence Ferrari: [phone] ? ?- Dr. Nicole Kindred: [phone] ? ?In the event of inclement weather, please call our main line at [phone] for an update on the status of any delays or closures. ? ?Dermatology Medication Tips: ?Please keep the boxes that topical medications come in in order to help keep track of the instructions about where and how to use these. Pharmacies typically print the medication instructions only on the boxes and not directly on the medication tubes.  ? ?If your medication is too expensive, please contact our office at [phone] option 4 or send [place] a message through [place].  ? ?We are  unable to tell what your co-pay for medications will be in advance as this is different depending on your insurance coverage. However, we may be able to find a substitute medication at lower cost or fill out paperwork to get insurance to cover a needed medication.  ? ?If a prior authorization is required to get your medication covered by your insurance company, please allow [place] 1-2 business days to complete this process. ? ?Drug prices often vary depending on where the prescription is filled and some pharmacies may offer cheaper prices. ? ?The website www.goodrx.com contains coupons for medications through different pharmacies. The prices here do not account for what the cost may be with help from insurance (it may be cheaper with your insurance), but the website can give you the price if you did not use any insurance.  ?- You can print the associated coupon and take it with your prescription to the pharmacy.  ?- You may also stop by our office during regular business hours and pick up a GoodRx coupon card.  ?- If you need your prescription sent electronically to a different pharmacy, notify our office through [facility] or by phone at [phone] option 4. ? ? ? ? ?Si Usted Necesita Algo Despu?s de Su Visita ? ?Tambi?n puede enviarnos un mensaje a trav?s de MyChart. Por lo general respondemos a los mensajes de MyChart en el transcurso de 1 a 2 d?as h?biles. ? ?Para renovar  recetas, por favor pida a su farmacia que se ponga en contacto con nuestra oficina. Nuestro n?mero de fax es el [phone]. ? ?Si tiene un asunto urgente cuando la cl?nica est? cerrada y que no puede esperar hasta el siguiente d?a h?bil, puede llamar/localizar a su doctor(a) al n?mero que aparece a continuaci?n.  ? ?Por favor, tenga en cuenta que aunque hacemos todo lo posible para estar disponibles para asuntos urgentes fuera del horario de oficina, no estamos disponibles las 24 horas del d?a, los 7 d?as de la semana.  ? ?Si tiene un  problema urgente y no puede comunicarse con nosotros, puede optar por buscar atenci?n m?dica  en el consultorio de su doctor(a), en una cl?nica privada, en un centro de atenci?n urgente o en una sala de emergencias. ? ?Si tiene Engineer, maintenance (IT) m?dica, por favor llame inmediatamente al 911 o vaya a la sala de emergencias. ? ?N?meros de b?per ? ?- Dr. Nehemiah Massed: [phone] ? ?- Dra. Moye: [phone] ? ?- Dra. Nicole Kindred: [phone] ? ?En caso de inclemencias del tiempo, por favor llame a nuestra l?nea principal al [phone] para una actualizaci?n sobre el estado de cualquier retraso o cierre. ? ?Consejos para la medicaci?n en dermatolog?a: ?Por favor, guarde las cajas en las que vienen los medicamentos de uso t?pico para ayudarle a seguir las instrucciones sobre d?nde y c?mo usarlos. Las farmacias generalmente imprimen las instrucciones del medicamento s?lo en las cajas y no directamente en los tubos del [place].  ? ?Si su medicamento es muy caro, por favor, p?ngase en contacto con Zigmund Daniel llamando al [phone] y presione la opci?n 4 o env?enos un mensaje a trav?s de MyChart.  ? ?No podemos decirle cu?l ser? su copago por los medicamentos por adelantado ya que esto es diferente dependiendo de la cobertura de su seguro. Sin embargo, es posible que podamos encontrar un medicamento sustituto a Electrical engineer un formulario para que el seguro cubra el medicamento que se considera necesario.  ? ?Si se requiere Ardelia Mems autorizaci?n previa para que su compa??a de seguros [place] su medicamento, por favor perm?tanos de 1 a 2 d?as h?biles para completar este proceso. ? ?Los precios de los medicamentos var?an con frecuencia dependiendo del Environmental consultant de d?nde se surte la receta y alguna farmacias pueden ofrecer precios m?s baratos. ? ?El sitio web www.goodrx.com tiene cupones para medicamentos de Airline pilot. Los precios aqu? no tienen en cuenta lo que podr?a costar con la ayuda del seguro (puede ser m?s  barato con su seguro), pero el sitio web puede darle el precio si no utiliz? ning?n seguro.  ?- Puede imprimir el cup?n correspondiente y llevarlo con su receta a la farmacia.  ?- Tambi?n puede pasar por nuestra oficina durante el horario de atenci?n regular y recoger una tarjeta de cupones de GoodRx.  ?- Si necesita que su receta se env?e electr?nicamente a Chiropodist, informe a nuestra oficina a trav?s de MyChart de [place] o por tel?fono llamando al [phone] y presione la opci?n 4.  ?

## 2021-09-14 NOTE — Progress Notes (Signed)
? ?  Follow-Up Visit ?  ?Subjective  ?Thomas [name] is a 56 y.o. male who presents for the following: Rash (Left axilla. Dur: 1 week. Burning, stinging. Used a new deodorant one time 3 weeks ago. No other changes. Has used Neosporin, not helping). ? ?The patient has spots, moles and lesions to be evaluated, some may be new or changing and the patient has concerns that these could be cancer. ? ? ?The following portions of the chart were reviewed this encounter and updated as appropriate:   ?  ? ?Review of Systems: No other skin or systemic complaints except as noted in HPI or Assessment and Plan. ? ? ?Objective  ?Well appearing patient in no apparent distress; mood and affect are within normal limits. ? ?A focused examination was performed including axillae. Relevant physical exam findings are noted in the Assessment and Plan. ? ?Left Axilla ?Erythematous papules forming patch ? ? ?Assessment & Plan  ?Irritant contact dermatitis, unspecified trigger ?Left Axilla ? ?Start Mometasone cream twice daily for no more than 2 weeks. Caution skin atrophy with long-term use.  ? ?Call in 2 weeks if not resolved.  ? ?Discontinue Neosporin due to potential contact allergy ? ?Avoid deodorant until healed, then can restart previous type that doesn't irritate ? ?mometasone (ELOCON) 0.1 % cream - Left Axilla ?Apply twice daily to underarm for no more than 2 weeks ? ?History of Melanoma In Situ ?- No evidence of recurrence today ?- Recommend regular full body skin exams ?- Recommend daily broad spectrum sunscreen SPF 30+ to sun-exposed areas, reapply every 2 hours as needed.  ?- Call if any new or changing lesions are noted between office visits  ? ?Return for Follow Up As Scheduled. ? ?I, Emelia Salisbury, CMA, am acting as scribe for Brendolyn Patty, MD. ? ?Documentation: I have reviewed the above documentation for accuracy and completeness, and I agree with the above. ? ?Brendolyn Patty MD  ? ?

## 2021-11-03 ENCOUNTER — Ambulatory Visit: Payer: 59 | Admitting: Dermatology

## 2021-11-03 ENCOUNTER — Encounter: Payer: Self-pay | Admitting: Dermatology

## 2021-11-03 DIAGNOSIS — L814 Other melanin hyperpigmentation: Secondary | ICD-10-CM

## 2021-11-03 DIAGNOSIS — D1800 Hemangioma unspecified site: Secondary | ICD-10-CM

## 2021-11-03 DIAGNOSIS — Z86018 Personal history of other benign neoplasm: Secondary | ICD-10-CM

## 2021-11-03 DIAGNOSIS — D229 Melanocytic nevi, unspecified: Secondary | ICD-10-CM

## 2021-11-03 DIAGNOSIS — L570 Actinic keratosis: Secondary | ICD-10-CM

## 2021-11-03 DIAGNOSIS — Z86006 Personal history of melanoma in-situ: Secondary | ICD-10-CM

## 2021-11-03 DIAGNOSIS — L821 Other seborrheic keratosis: Secondary | ICD-10-CM

## 2021-11-03 DIAGNOSIS — Z1283 Encounter for screening for malignant neoplasm of skin: Secondary | ICD-10-CM | POA: Diagnosis not present

## 2021-11-03 DIAGNOSIS — L578 Other skin changes due to chronic exposure to nonionizing radiation: Secondary | ICD-10-CM

## 2021-11-03 DIAGNOSIS — R21 Rash and other nonspecific skin eruption: Secondary | ICD-10-CM

## 2021-11-03 DIAGNOSIS — T148XXA Other injury of unspecified body region, initial encounter: Secondary | ICD-10-CM

## 2021-11-03 DIAGNOSIS — L739 Follicular disorder, unspecified: Secondary | ICD-10-CM

## 2021-11-03 MED ORDER — KETOCONAZOLE 2 % EX CREA: TOPICAL_CREAM | CUTANEOUS | 3 refills | Status: AC

## 2021-11-03 MED ORDER — CEPHALEXIN 500 MG PO CAPS: 500.0000 mg | ORAL_CAPSULE | Freq: Three times a day (TID) | ORAL | 0 refills | Status: AC

## 2021-11-03 NOTE — Patient Instructions (Addendum)
Cryotherapy Aftercare  Wash gently with soap and water everyday.   Apply Vaseline daily until healed.   Prior to procedure, discussed risks of blister formation, small wound, skin dyspigmentation, or rare scar following cryotherapy. Recommend Vaseline ointment to treated areas while healing.    Rash: Recommend using Vaseline Jelly to crusted areas. Cover with band-aids.   Groin: Start Ketoconazole cream twice daily.   Start Opzelura samples twice daily.   Underarm: Use Ketoconazole cream to underarm area twice daily.   Cephalexin. 500 mg 1 capsule 3 times a day for 5 days.     Recommend daily broad spectrum sunscreen SPF 30+ to sun-exposed areas, reapply every 2 hours as needed. Call for new or changing lesions.  Staying in the shade or wearing long sleeves, sun glasses (UVA+UVB protection) and wide brim hats (4-inch brim around the entire circumference of the hat) are also recommended for sun protection.    Melanoma ABCDEs  Melanoma is the most dangerous type of skin cancer, and is the leading cause of death from skin disease.  You are more likely to develop melanoma if you: Have light-colored skin, light-colored eyes, or red or blond hair Spend a lot of time in the sun Tan regularly, either outdoors or in a tanning bed Have had blistering sunburns, especially during childhood Have a close family member who has had a melanoma Have atypical moles or large birthmarks  Early detection of melanoma is key since treatment is typically straightforward and cure rates are extremely high if we catch it early.   The first sign of melanoma is often a change in a mole or a new dark spot.  The ABCDE system is a way of remembering the signs of melanoma.  A for asymmetry:  The two halves do not match. B for border:  The edges of the growth are irregular. C for color:  A mixture of colors are present instead of an even brown color. D for diameter:  Melanomas are usually (but not always)  greater than 40m - the size of a pencil eraser. E for evolution:  The spot keeps changing in size, shape, and color.  Please check your skin once per month between visits. You can use a small mirror in front and a large mirror behind you to keep an eye on the back side or your body.   If you see any new or changing lesions before your next follow-up, please call to schedule a visit.  Please continue daily skin protection including broad spectrum sunscreen SPF 30+ to sun-exposed areas, reapplying every 2 hours as needed when you're outdoors.   Staying in the shade or wearing long sleeves, sun glasses (UVA+UVB protection) and wide brim hats (4-inch brim around the entire circumference of the hat) are also recommended for sun protection.    Due to recent changes in healthcare laws, you may see results of your pathology and/or laboratory studies on MyChart before the doctors have had a chance to review them. We understand that in some cases there may be results that are confusing or concerning to you. Please understand that not all results are received at the same time and often the doctors may need to interpret multiple results in order to provide you with the best plan of care or course of treatment. Therefore, we ask that you please give u[place]2 business days to thoroughly review all your results before contacting the office for clarification. Should we see a critical lab result, you will be contacted  sooner.   If You Need Anything After Your Visit  If you have any questions or concerns for your doctor, please call our main line at [phone] and press option 4 to reach your doctor's medical assistant. If no one answers, please leave a voicemail as directed and we will return your call as soon as possible. Messages left after 4 pm will be answered the following business day.   You may also send [place] a message via [place]. We typically respond to MyChart messages within 1-2 business days.  For  prescription refills, please ask your pharmacy to contact our office. Our fax number is [phone].  If you have an urgent issue when the clinic is closed that cannot wait until the next business day, you can page your doctor at the number below.    Please note that while we do our best to be available for urgent issues outside of office hours, we are not available 24/7.   If you have an urgent issue and are unable to reach [place], you may choose to seek medical care at your doctor's office, retail clinic, urgent care center, or emergency room.  If you have a medical emergency, please immediately call 911 or go to the emergency department.  Pager Numbers  - Dr. Nehemiah Massed: [phone]  - Dr. Laurence Ferrari: [phone]  - Dr. Nicole Kindred: [phone]  In the event of inclement weather, please call our main line at [phone] for an update on the status of any delays or closures.  Dermatology Medication Tips: Please keep the boxes that topical medications come in in order to help keep track of the instructions about where and how to use these. Pharmacies typically print the medication instructions only on the boxes and not directly on the medication tubes.   If your medication is too expensive, please contact our office at [phone] option 4 or send [place] a message through [place].   We are unable to tell what your co-pay for medications will be in advance as this is different depending on your insurance coverage. However, we may be able to find a substitute medication at lower cost or fill out paperwork to get insurance to cover a needed medication.   If a prior authorization is required to get your medication covered by your insurance company, please allow [place] 1-2 business days to complete this process.  Drug prices often vary depending on where the prescription is filled and some pharmacies may offer cheaper prices.  The website www.goodrx.com contains coupons for medications through different  pharmacies. The prices here do not account for what the cost may be with help from insurance (it may be cheaper with your insurance), but the website can give you the price if you did not use any insurance.  - You can print the associated coupon and take it with your prescription to the pharmacy.  - You may also stop by our office during regular business hours and pick up a GoodRx coupon card.  - If you need your prescription sent electronically to a different pharmacy, notify our office through [facility] or by phone at [phone] option 4.     Si Usted Necesita Algo Despus de Su Visita  Tambin puede enviarnos un mensaje a travs de Pharmacist, community. Por lo general respondemos a los mensajes de MyChart en el transcurso de 1 a 2 das hbiles.  Para renovar recetas, por favor pida a su farmacia que se ponga en contacto con nuestra oficina. Harland Dingwall de fax es el  [phone].  Si tiene un asunto urgente cuando la clnica est cerrada y que no puede esperar hasta el siguiente da hbil, puede llamar/localizar a su doctor(a) al nmero que aparece a continuacin.   Por favor, tenga en cuenta que aunque hacemos todo lo posible para estar disponibles para asuntos urgentes fuera del horario de [place], no estamos disponibles las 24 horas del da, los 7 das de la [place].   Si tiene un problema urgente y no puede comunicarse con nosotros, puede optar por buscar atencin mdica  en el consultorio de su doctor(a), en una clnica privada, en un centro de atencin urgente o en una sala de emergencias.  Si tiene Engineering geologist, por favor llame inmediatamente al 911 o vaya a la sala de emergencias.  Nmeros de bper  - Dr. Nehemiah Massed: [phone]  - Dra. Moye: [phone]  - Dra. Nicole Kindred: [phone]  En caso de inclemencias del [place], por favor llame a Johnsie Kindred principal al [phone] para una actualizacin sobre el [place] de cualquier retraso o cierre.  Consejos para la  medicacin en dermatologa: Por favor, guarde las cajas en las que vienen los medicamentos de uso tpico para ayudarle a seguir las instrucciones sobre dnde y cmo usarlos. Las farmacias generalmente imprimen las instrucciones del medicamento slo en las cajas y no directamente en los tubos del [place].   Si su medicamento es muy caro, por favor, pngase en contacto con Zigmund Daniel llamando al [phone] y presione la opcin 4 o envenos un mensaje a travs de Pharmacist, community.   No podemos decirle cul ser su copago por los medicamentos por adelantado ya que esto es diferente dependiendo de la cobertura de su seguro. Sin embargo, es posible que podamos encontrar un medicamento sustituto a Electrical engineer un formulario para que el seguro cubra el medicamento que se considera necesario.   Si se requiere una autorizacin previa para que su compaa de seguros [place] su medicamento, por favor permtanos de 1 a 2 das hbiles para completar este proceso.  Los precios de los medicamentos varan con frecuencia dependiendo del Environmental consultant de dnde se surte la receta y alguna farmacias pueden ofrecer precios ms baratos.  El sitio web www.goodrx.com tiene cupones para medicamentos de Airline pilot. Los precios aqu no tienen en cuenta lo que podra costar con la ayuda del seguro (puede ser ms barato con su seguro), pero el sitio web puede darle el precio si no utiliz Research scientist (physical sciences).  - Puede imprimir el cupn correspondiente y llevarlo con su receta a la farmacia.  - Tambin puede pasar por nuestra oficina durante el horario de atencin regular y Charity fundraiser una tarjeta de cupones de GoodRx.  - Si necesita que su receta se enve electrnicamente a una farmacia diferente, informe a nuestra oficina a travs de MyChart de [place] o por telfono llamando al [phone] y presione la opcin 4.

## 2021-11-03 NOTE — Progress Notes (Signed)
Follow-Up Visit   Subjective  Thomas [name] is a 56 y.o. male who presents for the following: Annual Exam (Here for skin cancer screening. Full body. Hx of MMIS. Areas of concern today. ).  The patient presents for Total-Body Skin Exam (TBSE) for skin cancer screening and mole check.  The patient has spots, moles and lesions to be evaluated, some may be new or changing and the patient has concerns that these could be cancer.  The following portions of the chart were reviewed this encounter and updated as appropriate:  Tobacco  Allergies  Meds  Problems  Med Hx  Surg Hx  Fam Hx      Review of Systems: No other skin or systemic complaints except as noted in HPI or Assessment and Plan.   Objective  Well appearing patient in no apparent distress; mood and affect are within normal limits.  A full examination was performed including scalp, head, eyes, ears, nose, lips, neck, chest, axillae, abdomen, back, buttocks, bilateral upper extremities, bilateral lower extremities, hands, feet, fingers, toes, fingernails, and toenails. All findings within normal limits unless otherwise noted below.  Right Dorsal Hand x3 (3) Erythematous thin papules/macules with gritty scale.   Left Axilla Erythematous follicular based papules  torso, upper arms Eroded patches  scrotum Lichenification and erythema at scrotum   Assessment & Plan   History of Melanoma in Situ. Mid back, right midline medial. Bx: 06/15/2021, Excised 07/14/2021. - No evidence of recurrence today - No lymphadenopathy - Recommend regular full body skin exams - Recommend daily broad spectrum sunscreen SPF 30+ to sun-exposed areas, reapply every 2 hours as needed.  - Call if any new or changing lesions are noted between office visits   History of Dysplastic Nevus. Moderate atypia. Mid back, right midline lateral. 06/15/2021. - No evidence of recurrence today - Recommend regular full body skin exams - Recommend daily  broad spectrum sunscreen SPF 30+ to sun-exposed areas, reapply every 2 hours as needed.  - Call if any new or changing lesions are noted between office visits   Lentigines - Scattered tan macules - Due to sun exposure - Benign-appearing, observe - Recommend daily broad spectrum sunscreen SPF 30+ to sun-exposed areas, reapply every 2 hours as needed. - Call for any changes  Seborrheic Keratoses - Stuck-on, waxy, tan-brown papules and/or plaques  - Benign-appearing - Discussed benign etiology and prognosis. - Observe - Call for any changes  Melanocytic Nevi - Tan-brown and/or pink-flesh-colored symmetric macules and papules - Benign appearing on exam today - Observation - Call clinic for new or changing moles - Recommend daily use of broad spectrum spf 30+ sunscreen to sun-exposed areas.   Hemangiomas - Red papules - Discussed benign nature - Observe - Call for any changes  Actinic Damage - Chronic condition, secondary to cumulative UV/sun exposure - diffuse scaly erythematous macules with underlying dyspigmentation - Recommend daily broad spectrum sunscreen SPF 30+ to sun-exposed areas, reapply every 2 hours as needed.  - Staying in the shade or wearing long sleeves, sun glasses (UVA+UVB protection) and wide brim hats (4-inch brim around the entire circumference of the hat) are also recommended for sun protection.  - Call for new or changing lesions.  Skin cancer screening performed today.  AK (actinic keratosis) (3) Right Dorsal Hand x3  Actinic keratoses are precancerous spots that appear secondary to cumulative UV radiation exposure/sun exposure over time. They are chronic with expected duration over 1 year. A portion of actinic keratoses will progress to  squamous cell carcinoma of the skin. It is not possible to reliably predict which spots will progress to skin cancer and so treatment is recommended to prevent development of skin cancer.  Recommend daily broad spectrum  sunscreen SPF 30+ to sun-exposed areas, reapply every 2 hours as needed.  Recommend staying in the shade or wearing long sleeves, sun glasses (UVA+UVB protection) and wide brim hats (4-inch brim around the entire circumference of the hat). Call for new or changing lesions.  Destruction of lesion - Right Dorsal Hand x3  Destruction method: cryotherapy   Informed consent: discussed and consent obtained   Lesion destroyed using liquid nitrogen: Yes   Outcome: patient tolerated procedure well with no complications   Post-procedure details: wound care instructions given   Additional details:  Prior to procedure, discussed risks of blister formation, small wound, skin dyspigmentation, or rare scar following cryotherapy. Recommend Vaseline ointment to treated areas while healing.   Folliculitis Left Axilla  Use Ketoconazole cream to underarm area twice daily.   Cephalexin. 500 mg 1 capsule 3 times a day for 5 days.  D/c topical steroid  cephALEXin (KEFLEX) 500 MG capsule - Left Axilla Take 1 capsule (500 mg total) by mouth 3 (three) times daily for 5 days.  Open wound torso, upper arms  Secondary to likely erythema-multiforme minor reaction, possibly to doxycycline, resolving  Recommend using Vaseline Jelly to crusted areas. Cover with band-aids.   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   Start Opzelura samples twice daily.   ketoconazole (NIZORAL) 2 % cream - scrotum Apply twice daily to affected areas under arm and groin   Return in 6 months (on 05/06/2022) for TBSE, HxMIS .  I, Emelia Salisbury, CMA, scribed for Alfonso Patten, MD.  Documentation: I have reviewed the above documentation for accuracy and completeness, and I agree with the above.  Forest Gleason, MD

## 2021-11-11 ENCOUNTER — Ambulatory Visit: Payer: 59 | Admitting: Dermatology

## 2021-11-19 ENCOUNTER — Telehealth: Payer: Self-pay | Admitting: *Deleted

## 2021-11-19 NOTE — Chronic Care Management (AMB) (Signed)
  Care Coordination  Note  11/19/2021 Name: Vonn Sliger Kitko MRN: [id number] DOB: Oct 11, 1965  Belen Zwahlen Lamp is a 56 y.o. year old male who is a primary care patient of Carrie Mew Wilhemena Durie, MD. I reached out to Lavada Mesi by phone today to offer care coordination services.      Mr. Chandley was given information about Care Coordination services today including:  The Care Coordination services include support from the care team which includes your Nurse Coordinator, Clinical Social Worker, or Pharmacist.  The Care Coordination team is here to help remove barriers to the health concerns and goals most important to you. Care Coordination services are voluntary and the patient may decline or stop services at any time by request to their care team member.   Patient agreed to services and verbal consent obtained.   Follow up plan: Telephone appointment with care coordination team member scheduled for: 11/23/2021  Julian Hy, [place] Direct Dial: [phone]

## 2021-11-23 ENCOUNTER — Emergency Department: Payer: 59

## 2021-11-23 ENCOUNTER — Encounter: Payer: Self-pay | Admitting: Emergency Medicine

## 2021-11-23 ENCOUNTER — Emergency Department
Admission: EM | Admit: 2021-11-23 | Discharge: 2021-11-23 | Disposition: A | Discharge status: Home / Self Care | Payer: 59 | Attending: Emergency Medicine | Admitting: Emergency Medicine

## 2021-11-23 DIAGNOSIS — S61210A Laceration without foreign body of right index finger without damage to nail, initial encounter: Secondary | ICD-10-CM | POA: Insufficient documentation

## 2021-11-23 DIAGNOSIS — S6991XA Unspecified injury of right wrist, hand and finger(s), initial encounter: Secondary | ICD-10-CM | POA: Diagnosis present

## 2021-11-23 DIAGNOSIS — W232XXA Caught, crushed, jammed or pinched between a moving and stationary object, initial encounter: Secondary | ICD-10-CM | POA: Insufficient documentation

## 2021-11-23 MED ORDER — CEPHALEXIN 500 MG PO CAPS: 1000.0000 mg | ORAL_CAPSULE | Freq: Two times a day (BID) | ORAL | 0 refills | Status: DC

## 2021-11-23 NOTE — ED Triage Notes (Signed)
Pt with right 3rd finger laceration after pt had a trailer gait wire fall. Pt with approx 3 inch laceration. Bleeding controlled.

## 2021-11-23 NOTE — ED Notes (Signed)
Patient soaking finger in sterile water, iodine, and CHG solution.

## 2021-11-23 NOTE — Chronic Care Management (AMB) (Signed)
Per provider/leaders cancel appt - LM on pt VM

## 2021-11-23 NOTE — ED Notes (Signed)
Patient discharged at this time. Ambulated to lobby with independent and steady gait. Breathing unlabored speaking in full sentences. Verbalized understanding of all discharge, follow up, and medication teaching. Discharged homed with all belongings.   

## 2021-11-23 NOTE — ED Provider Notes (Signed)
[facility] Provider Note  Patient Contact: 9:07 PM (approximate)   History   Injury   HPI  Thomas [name] is a 56 y.o. male who presents the emergency department complaining of laceration to the index finger of the right hand.  Patient was loading a trailer, when the trailer fell open and the edge of the key caught his finger.  Patient sustained a ragged laceration across the finger pad.  Bleeding controlled prior to arrival.  Full range of motion preserved.  No other injury or complaint.  Tetanus shot up-to-date at this time.     Physical Exam   Triage Vital Signs: ED Triage Vitals [11/23/21 1942]  Enc Vitals Group     BP (!) 162/96     Pulse Rate (!) 55     Resp 20     Temp 97.8 F (36.6 C)     Temp Source Oral     SpO2 95 %     Weight      Height      Head Circumference      Peak Flow      Pain Score      Pain Loc      Pain Edu?      Excl. in [place]?     Most recent vital signs: Vitals:   11/23/21 1942  BP: (!) 162/96  Pulse: (!) 55  Resp: 20  Temp: 97.8 F (36.6 C)  SpO2: 95%     General: Alert and in no acute distress. Cardiovascular:  Good peripheral perfusion Respiratory: Normal respiratory effort without tachypnea or retractions. Lungs CTAB.  Musculoskeletal: Full range of motion to all extremities.  Visualization of the index finger of the right hand reveals a regular laceration measuring approximately 3 cm in length.  No active bleeding.  No evidence of foreign body.  Full range of motion to the digit.  Sensation capillary refill intact. Neurologic:  No gross focal neurologic deficits are appreciated.  Skin:   No rash noted Other:   ED Results / Procedures / Treatments   Labs (all labs ordered are listed, but only abnormal results are displayed) Labs Reviewed - No data to display   EKG     RADIOLOGY  I personally viewed, evaluated, and interpreted these images as part of my medical decision making, as well as  reviewing the written report by the radiologist.  ED Provider Interpretation: No retained foreign body.  No underlying osseous abnormality.  DG Finger Middle Right  Result Date: 11/23/2021 CLINICAL DATA:  Laceration EXAM: RIGHT MIDDLE FINGER 2+V COMPARISON:  None Available. FINDINGS: No fracture or malalignment. No radiopaque foreign body in the soft tissues. Mild degenerative change of the PIP joint IMPRESSION: No acute osseous abnormality Electronically Signed   By: Donavan Foil M.D.   On: 11/23/2021 20:05    PROCEDURES:  Critical Care performed: No  ..Laceration Repair  Date/Time: 11/23/2021 9:07 PM  Performed by: Darletta Moll, PA-C Authorized by: Darletta Moll, PA-C   Consent:    Consent obtained:  Verbal   Consent given by:  Patient   Risks discussed:  Infection, pain and poor wound healing Universal protocol:    Procedure explained and questions answered to patient or proxy's satisfaction: yes     Immediately prior to procedure, a time out was called: yes     Patient identity confirmed:  Verbally with patient Anesthesia:    Anesthesia method:  None Laceration details:    Location:  Finger   Finger location:  R long finger   Length (cm):  3 Exploration:    Imaging obtained: x-ray     Imaging outcome: foreign body not noted     Wound exploration: wound explored through full range of motion and entire depth of wound visualized     Wound extent: no foreign bodies/material noted, no nerve damage noted, no tendon damage noted and no underlying fracture noted     Contaminated: no   Treatment:    Area cleansed with:  Povidone-iodine and saline   Amount of cleaning:  Extensive   Irrigation solution:  Sterile saline   Irrigation volume:  1L Skin repair:    Repair method:  Steri-Strips and tissue adhesive   Number of Steri-Strips:  3 Approximation:    Approximation:  Close Repair type:    Repair type:  Simple Post-procedure details:    Dressing:  Splint  for protection   Procedure completion:  Tolerated well, no immediate complications    MEDICATIONS ORDERED IN ED: Medications - No data to display   IMPRESSION / MDM / [place] / ED COURSE  I reviewed the triage vital signs and the nursing notes.                              Differential diagnosis includes, but is not limited to, finger laceration, retained foreign body, finger fracture  Patient's presentation is most consistent with acute illness / injury with system symptoms.   Patient's diagnosis is consistent with finger laceration.  Patient presented to the ED after sustaining a laceration to the index finger of his right hand.  Patient had a ragged laceration and there was 2 options for closure.  We could use Dermabond with Steri-Strips or sutures.  Patient was given the option decided to pursue the Steri-Strips and adhesive option.  Feel this is reasonable.  This was performed with no complications.  Wound care instructions discussed with the patient.  Antibiotics prophylactically.  His tetanus shot was up-to-date and did not need updating today.  Follow-up with primary care as needed..  Patient is given ED precautions to return to the ED for any worsening or new symptoms.        FINAL CLINICAL IMPRESSION(S) / ED DIAGNOSES   Final diagnoses:  Laceration of right index finger without foreign body without damage to nail, initial encounter     Rx / DC Orders   ED Discharge Orders          Ordered    cephALEXin (KEFLEX) 500 MG capsule  2 times daily        11/23/21 2109             Note:  This document was prepared using Dragon voice recognition software and may include unintentional dictation errors.   Brynda Peon 11/23/21 2112    Duffy Bruce, MD 11/23/21 [phone]

## 2022-05-11 ENCOUNTER — Ambulatory Visit (INDEPENDENT_AMBULATORY_CARE_PROVIDER_SITE_OTHER): Payer: Medicaid Other | Admitting: Dermatology

## 2022-05-11 DIAGNOSIS — L814 Other melanin hyperpigmentation: Secondary | ICD-10-CM

## 2022-05-11 DIAGNOSIS — L578 Other skin changes due to chronic exposure to nonionizing radiation: Secondary | ICD-10-CM

## 2022-05-11 DIAGNOSIS — Z1283 Encounter for screening for malignant neoplasm of skin: Secondary | ICD-10-CM

## 2022-05-11 DIAGNOSIS — Z86018 Personal history of other benign neoplasm: Secondary | ICD-10-CM

## 2022-05-11 DIAGNOSIS — L739 Follicular disorder, unspecified: Secondary | ICD-10-CM

## 2022-05-11 DIAGNOSIS — L821 Other seborrheic keratosis: Secondary | ICD-10-CM

## 2022-05-11 DIAGNOSIS — Z86006 Personal history of melanoma in-situ: Secondary | ICD-10-CM

## 2022-05-11 DIAGNOSIS — D229 Melanocytic nevi, unspecified: Secondary | ICD-10-CM

## 2022-05-11 MED ORDER — KETOCONAZOLE 2 % EX CREA: TOPICAL_CREAM | CUTANEOUS | 11 refills | Status: AC

## 2022-05-11 MED ORDER — CEPHALEXIN 500 MG PO CAPS: 500.0000 mg | ORAL_CAPSULE | Freq: Three times a day (TID) | ORAL | 1 refills | Status: DC

## 2022-05-11 MED ORDER — CLINDAMYCIN PHOSPHATE 1 % EX GEL: CUTANEOUS | 11 refills | Status: DC

## 2022-05-11 NOTE — Patient Instructions (Addendum)
For inflammation at groin / under arms   Start cephalexin 500 mg capsule - take 1 capsule by mouth three times daily for 1 week.   Start ketoconazole 2 % cream - apply topically to areas of inflamation at groin and under arms twice daily  Start clindamycin 1 % gel - apply topically twice daily to inflamed areas at groin   Recommend Vaseline or Aquaphor ointment to areas inflamed by clothes rubbing at skin     Recommend taking Heliocare sun protection supplement daily in sunny weather for additional sun protection. For maximum protection on the sunniest days, you can take up to 2 capsules of regular Heliocare OR take 1 capsule of Heliocare Ultra. For prolonged exposure (such as a full day in the sun), you can repeat your dose of the supplement 4 hours after your first dose. Heliocare can be purchased at [employer], at some [facility] or at [email or web address].     Melanoma ABCDEs  Melanoma is the most dangerous type of skin cancer, and is the leading cause of death from skin disease.  You are more likely to develop melanoma if you: Have light-colored skin, light-colored eyes, or red or blond hair Spend a lot of time in the sun Tan regularly, either outdoors or in a tanning bed Have had blistering sunburns, especially during childhood Have a close family member who has had a melanoma Have atypical moles or large birthmarks  Early detection of melanoma is key since treatment is typically straightforward and cure rates are extremely high if we catch it early.   The first sign of melanoma is often a change in a mole or a new dark spot.  The ABCDE system is a way of remembering the signs of melanoma.  A for asymmetry:  The two halves do not match. B for border:  The edges of the growth are irregular. C for color:  A mixture of colors are present instead of an even brown color. D for diameter:  Melanomas are usually (but not always) greater than 90m - the size of a pencil eraser. E  for evolution:  The spot keeps changing in size, shape, and color.  Please check your skin once per month between visits. You can use a small mirror in front and a large mirror behind you to keep an eye on the back side or your body.   If you see any new or changing lesions before your next follow-up, please call to schedule a visit.  Please continue daily skin protection including broad spectrum sunscreen SPF 30+ to sun-exposed areas, reapplying every 2 hours as needed when you're outdoors.   Staying in the shade or wearing long sleeves, sun glasses (UVA+UVB protection) and wide brim hats (4-inch brim around the entire circumference of the hat) are also recommended for sun protection.    Due to recent changes in healthcare laws, you may see results of your pathology and/or laboratory studies on MyChart before the doctors have had a chance to review them. We understand that in some cases there may be results that are confusing or concerning to you. Please understand that not all results are received at the same time and often the doctors may need to interpret multiple results in order to provide you with the best plan of care or course of treatment. Therefore, we ask that you please give u[place]2 business days to thoroughly review all your results before contacting the office for clarification. Should we see a critical lab result,  you will be contacted sooner.   If You Need Anything After Your Visit  If you have any questions or concerns for your doctor, please call our main line at [phone] and press option 4 to reach your doctor's medical assistant. If no one answers, please leave a voicemail as directed and we will return your call as soon as possible. Messages left after 4 pm will be answered the following business day.   You may also send [place] a message via [place]. We typically respond to MyChart messages within 1-2 business days.  For prescription refills, please ask your pharmacy to contact  our office. Our fax number is [phone].  If you have an urgent issue when the clinic is closed that cannot wait until the next business day, you can page your doctor at the number below.    Please note that while we do our best to be available for urgent issues outside of office hours, we are not available 24/7.   If you have an urgent issue and are unable to reach [place], you may choose to seek medical care at your doctor's office, retail clinic, urgent care center, or emergency room.  If you have a medical emergency, please immediately call 911 or go to the emergency department.  Pager Numbers  - Dr. Nehemiah Massed: [phone]  - Dr. Laurence Ferrari: [phone]  - Dr. Nicole Kindred: [phone]  In the event of inclement weather, please call our main line at [phone] for an update on the status of any delays or closures.  Dermatology Medication Tips: Please keep the boxes that topical medications come in in order to help keep track of the instructions about where and how to use these. Pharmacies typically print the medication instructions only on the boxes and not directly on the medication tubes.   If your medication is too expensive, please contact our office at [phone] option 4 or send [place] a message through [place].   We are unable to tell what your co-pay for medications will be in advance as this is different depending on your insurance coverage. However, we may be able to find a substitute medication at lower cost or fill out paperwork to get insurance to cover a needed medication.   If a prior authorization is required to get your medication covered by your insurance company, please allow [place] 1-2 business days to complete this process.  Drug prices often vary depending on where the prescription is filled and some pharmacies may offer cheaper prices.  The website www.goodrx.com contains coupons for medications through different pharmacies. The prices here do not account for what the cost  may be with help from insurance (it may be cheaper with your insurance), but the website can give you the price if you did not use any insurance.  - You can print the associated coupon and take it with your prescription to the pharmacy.  - You may also stop by our office during regular business hours and pick up a GoodRx coupon card.  - If you need your prescription sent electronically to a different pharmacy, notify our office through [facility] or by phone at [phone] option 4.     Si Usted Necesita Algo Despus de Su Visita  Tambin puede enviarnos un mensaje a travs de Pharmacist, community. Por lo general respondemos a los mensajes de MyChart en el transcurso de 1 a 2 das hbiles.  Para renovar recetas, por favor pida a su farmacia que se ponga en contacto con nuestra oficina. Oswald Hillock nmero  de fax es el [phone].  Si tiene un asunto urgente cuando la clnica est cerrada y que no puede esperar hasta el siguiente da hbil, puede llamar/localizar a su doctor(a) al nmero que aparece a continuacin.   Por favor, tenga en cuenta que aunque hacemos todo lo posible para estar disponibles para asuntos urgentes fuera del horario de [place], no estamos disponibles las 24 horas del da, los 7 das de la [place].   Si tiene un problema urgente y no puede comunicarse con nosotros, puede optar por buscar atencin mdica  en el consultorio de su doctor(a), en una clnica privada, en un centro de atencin urgente o en una sala de emergencias.  Si tiene Engineering geologist, por favor llame inmediatamente al 911 o vaya a la sala de emergencias.  Nmeros de bper  - Dr. Nehemiah Massed: [phone]  - Dra. Moye: [phone]  - Dra. Nicole Kindred: [phone]  En caso de inclemencias del [place], por favor llame a Johnsie Kindred principal al [phone] para una actualizacin sobre el [place] de cualquier retraso o cierre.  Consejos para la medicacin en dermatologa: Por favor, guarde las cajas en  las que vienen los medicamentos de uso tpico para ayudarle a seguir las instrucciones sobre dnde y cmo usarlos. Las farmacias generalmente imprimen las instrucciones del medicamento slo en las cajas y no directamente en los tubos del [place].   Si su medicamento es muy caro, por favor, pngase en contacto con Zigmund Daniel llamando al [phone] y presione la opcin 4 o envenos un mensaje a travs de Pharmacist, community.   No podemos decirle cul ser su copago por los medicamentos por adelantado ya que esto es diferente dependiendo de la cobertura de su seguro. Sin embargo, es posible que podamos encontrar un medicamento sustituto a Electrical engineer un formulario para que el seguro cubra el medicamento que se considera necesario.   Si se requiere una autorizacin previa para que su compaa de seguros [place] su medicamento, por favor permtanos de 1 a 2 das hbiles para completar este proceso.  Los precios de los medicamentos varan con frecuencia dependiendo del Environmental consultant de dnde se surte la receta y alguna farmacias pueden ofrecer precios ms baratos.  El sitio web www.goodrx.com tiene cupones para medicamentos de Airline pilot. Los precios aqu no tienen en cuenta lo que podra costar con la ayuda del seguro (puede ser ms barato con su seguro), pero el sitio web puede darle el precio si no utiliz Research scientist (physical sciences).  - Puede imprimir el cupn correspondiente y llevarlo con su receta a la farmacia.  - Tambin puede pasar por nuestra oficina durante el horario de atencin regular y Charity fundraiser una tarjeta de cupones de GoodRx.  - Si necesita que su receta se enve electrnicamente a una farmacia diferente, informe a nuestra oficina a travs de MyChart de [place] o por telfono llamando al [phone] y presione la opcin 4.

## 2022-05-11 NOTE — Progress Notes (Signed)
Follow-Up Visit   Subjective  Thomas [name] is a 57 y.o. male who presents for the following: Annual Exam (1 year tbse, hx of aks, hx of dysplastic nevus, hx of melanoma in situ. Patient reports some bumps at groin he would like checked. ).  The patient presents for Total-Body Skin Exam (TBSE) for skin cancer screening and mole check.  The patient has spots, moles and lesions to be evaluated, some may be new or changing and the patient has concerns that these could be cancer.   The following portions of the chart were reviewed this encounter and updated as appropriate:  Tobacco  Allergies  Meds  Problems  Med Hx  Surg Hx  Fam Hx      Review of Systems: No other skin or systemic complaints except as noted in HPI or Assessment and Plan.   Objective  Well appearing patient in no apparent distress; mood and affect are within normal limits.  A full examination was performed including scalp, head, eyes, ears, nose, lips, neck, chest, axillae, abdomen, back, buttocks, bilateral upper extremities, bilateral lower extremities, hands, feet, fingers, toes, fingernails, and toenails. All findings within normal limits unless otherwise noted below.  groin , axilla Multiple perifollicular pustules and inflammatory papules   Assessment & Plan  Folliculitis groin , axilla  Chronic and persistent condition with duration over one year. Condition is bothersome/symptomatic for patient. Currently flared.  Recommend Hibiclens wash once weekly to affected areas. Avoid face. Sample given at appointment  Start cephalexin 500 mg capsule - take 1 capsule by mouth three times daily for 1 week.  With 1 refill to use if flared again  Start ketoconazole 2 % cream - apply topically to areas of inflamation at groin and under arms twice daily  Start clindamycin 1 % gel - apply topically twice daily to inflamed areas at groin   Recommend applying vaseline or aquaphor cream to use at areas where  clothes rub at skin.  Folliculitis is an often chronic, recurrent inflammatory condition affecting the hair follicles related to yeast or bacterial growth on the skin.   clindamycin (CLINDAGEL) 1 % gel - groin , axilla Apply topically twice daily to inflamed areas at groin  ketoconazole (NIZORAL) 2 % cream - groin , axilla Apply to areas on inflamation at groin and under arms twice daily  Related Medications cephALEXin (KEFLEX) 500 MG capsule Take 1 capsule (500 mg total) by mouth 3 (three) times daily. For inflammation at groin  Lentigines - Scattered tan macules - Due to sun exposure - Benign-appearing, observe - Recommend daily broad spectrum sunscreen SPF 30+ to sun-exposed areas, reapply every 2 hours as needed. - Call for any changes  Seborrheic Keratoses - Stuck-on, waxy, tan-brown papules and/or plaques  - Benign-appearing - Discussed benign etiology and prognosis. - Observe - Call for any changes  Melanocytic Nevi - Tan-brown and/or pink-flesh-colored symmetric macules and papules - Benign appearing on exam today - Observation - Call clinic for new or changing moles - Recommend daily use of broad spectrum spf 30+ sunscreen to sun-exposed areas.   Hemangiomas - Red papules - Discussed benign nature - Observe - Call for any changes  Actinic Damage - Chronic condition, secondary to cumulative UV/sun exposure - diffuse scaly erythematous macules with underlying dyspigmentation - Recommend daily broad spectrum sunscreen SPF 30+ to sun-exposed areas, reapply every 2 hours as needed.  - Staying in the shade or wearing long sleeves, sun glasses (UVA+UVB protection) and wide brim hats (  4-inch brim around the entire circumference of the hat) are also recommended for sun protection.  - Call for new or changing lesions.  History of Melanoma in Situ. Mid back, right midline medial. Bx: 06/15/2021, Excised 07/14/2021. - No evidence of recurrence today - No  lymphadenopathy - Recommend regular full body skin exams - Recommend daily broad spectrum sunscreen SPF 30+ to sun-exposed areas, reapply every 2 hours as needed.  - Call if any new or changing lesions are noted between office visits  History of Dysplastic Nevus. Moderate atypia. Mid back, right midline lateral. 06/15/2021. - No evidence of recurrence today - Recommend regular full body skin exams - Recommend daily broad spectrum sunscreen SPF 30+ to sun-exposed areas, reapply every 2 hours as needed.  - Call if any new or changing lesions are noted between office visits  Skin cancer screening performed today. Return for 4 month tbse hx of mmis. I, Ruthell Rummage, CMA, am acting as scribe for Forest Gleason, MD.  Documentation: I have reviewed the above documentation for accuracy and completeness, and I agree with the above.  Forest Gleason, MD

## 2022-05-12 MED ORDER — CEPHALEXIN 500 MG PO CAPS: 500.0000 mg | ORAL_CAPSULE | Freq: Three times a day (TID) | ORAL | 1 refills | Status: DC

## 2022-05-18 ENCOUNTER — Telehealth: Payer: Self-pay

## 2022-05-18 NOTE — Telephone Encounter (Signed)
Patients insurance is denying Clindamycin Phosphate GEL due to the Solution being on formulary. Okay to switch? Please advise.

## 2022-05-18 NOTE — Telephone Encounter (Signed)
Yes, thank you.

## 2022-05-19 ENCOUNTER — Other Ambulatory Visit: Payer: Self-pay

## 2022-05-19 MED ORDER — CLINDAMYCIN PHOSPHATE 1 % EX SOLN: Freq: Two times a day (BID) | CUTANEOUS | 11 refills | Status: DC

## 2022-05-19 NOTE — Progress Notes (Signed)
Clindamycin GEL not covered through insurance, solution is preferred. Sent in per Dr. Rowland Lathe instructions

## 2022-05-21 ENCOUNTER — Encounter: Payer: Self-pay | Admitting: Dermatology

## 2022-06-16 ENCOUNTER — Encounter: Payer: Self-pay | Admitting: Dermatology

## 2022-07-29 ENCOUNTER — Ambulatory Visit
Admission: RE | Admit: 2022-07-29 | Discharge: 2022-07-29 | Disposition: A | Discharge status: Home / Self Care | Payer: Medicaid Other | Source: Ambulatory Visit | Attending: Acute Care | Admitting: Acute Care

## 2022-07-29 ENCOUNTER — Other Ambulatory Visit: Payer: 59

## 2022-07-29 DIAGNOSIS — Z87891 Personal history of nicotine dependence: Secondary | ICD-10-CM

## 2022-08-01 ENCOUNTER — Other Ambulatory Visit: Payer: Self-pay

## 2022-08-01 DIAGNOSIS — Z87891 Personal history of nicotine dependence: Secondary | ICD-10-CM

## 2022-08-10 ENCOUNTER — Ambulatory Visit (INDEPENDENT_AMBULATORY_CARE_PROVIDER_SITE_OTHER): Payer: Medicaid Other | Admitting: Dermatology

## 2022-08-10 VITALS — BP 178/104 | HR 68

## 2022-08-10 DIAGNOSIS — L578 Other skin changes due to chronic exposure to nonionizing radiation: Secondary | ICD-10-CM

## 2022-08-10 DIAGNOSIS — S61411A Laceration without foreign body of right hand, initial encounter: Secondary | ICD-10-CM | POA: Diagnosis not present

## 2022-08-10 DIAGNOSIS — L570 Actinic keratosis: Secondary | ICD-10-CM | POA: Diagnosis not present

## 2022-08-10 DIAGNOSIS — L821 Other seborrheic keratosis: Secondary | ICD-10-CM | POA: Diagnosis not present

## 2022-08-10 DIAGNOSIS — R21 Rash and other nonspecific skin eruption: Secondary | ICD-10-CM

## 2022-08-10 MED ORDER — CEPHALEXIN 500 MG PO CAPS: 500.0000 mg | ORAL_CAPSULE | Freq: Three times a day (TID) | ORAL | 0 refills | Status: AC

## 2022-08-10 MED ORDER — TRIAMCINOLONE ACETONIDE 0.1 % EX OINT: TOPICAL_OINTMENT | CUTANEOUS | 0 refills | Status: AC

## 2022-08-10 NOTE — Progress Notes (Signed)
Follow-Up Visit   Subjective  Thomas [name] is a 57 y.o. male who presents for the following: Spot - on the R forearm and B/L temples, patient is concerned and would like them checked today, states started as blisters. He has a hx of MM.   He also has a new laceration of his right hand last night on a new light fixture  The patient has spots, moles and lesions to be evaluated, some may be new or changing and the patient has concerns that these could be cancer.  The following portions of the chart were reviewed this encounter and updated as appropriate: medications, allergies, medical history  Review of Systems:  No other skin or systemic complaints except as noted in HPI or Assessment and Plan.  Objective  Well appearing patient in no apparent distress; mood and affect are within normal limits.  A focused examination was performed of the following areas: The face, arms, and hands   Relevant physical exam findings are noted in the Assessment and Plan.  right palm Laceration of hand extending to deep subcutaneous fat    Assessment & Plan   Laceration of right hand without foreign body, initial encounter right palm  Deep laceration - cut R palm open last night on metal light fixture, pt states has recently had tetanus vaccine within the past year  Numbed with 6 cc lido w/epi for exploration, irrigation and debriding  Given delayed presentation >18 hours and patient with diabetes, did not suture  Patient with diabetes and without easy access to medical care Start cephalexin 500 mg TID for 5 days  Wash with soap and water daily and apply vaseline and bandage. Seek medical care for increasing pain, spreading redness, fevers, chills     RASH  Exam: R ventral forearm pink scaly pink homogenous plaque without features suspicious for malignancy on dermoscopy  Differential diagnosis:  Allergic contact dermatitis vs other   Treatment Plan: Start TMC 0.1% cream BID x  2 weeks. Topical steroids (such as triamcinolone, fluocinolone, fluocinonide, mometasone, clobetasol, halobetasol, betamethasone, hydrocortisone) can cause thinning and lightening of the skin if they are used for too long in the same area. Your physician has selected the right strength medicine for your problem and area affected on the body. Please use your medication only as directed by your physician to prevent side effects.    ACTINIC KERATOSIS Exam: Erythematous thin papules/macules with gritty scale  Actinic keratoses are precancerous spots that appear secondary to cumulative UV radiation exposure/sun exposure over time. They are chronic with expected duration over 1 year. A portion of actinic keratoses will progress to squamous cell carcinoma of the skin. It is not possible to reliably predict which spots will progress to skin cancer and so treatment is recommended to prevent development of skin cancer.  Recommend daily broad spectrum sunscreen SPF 30+ to sun-exposed areas, reapply every 2 hours as needed.  Recommend staying in the shade or wearing long sleeves, sun glasses (UVA+UVB protection) and wide brim hats (4-inch brim around the entire circumference of the hat). Call for new or changing lesions.  Treatment Plan:  Prior to procedure, discussed risks of blister formation, small wound, skin dyspigmentation, or rare scar following cryotherapy. Recommend Vaseline ointment to treated areas while healing.  Destruction Procedure Note Destruction method: cryotherapy   Informed consent: discussed and consent obtained   Lesion destroyed using liquid nitrogen: Yes   Outcome: patient tolerated procedure well with no complications   Post-procedure details: wound care  instructions given   Locations: L dorsal hand x 2, L forearm x 2 # of Lesions Treated: 4   SEBORRHEIC KERATOSIS - Stuck-on, waxy, tan-brown papules and/or plaques  - Benign-appearing - Discussed benign etiology and  prognosis. - Observe - Call for any changes  ACTINIC DAMAGE - chronic, secondary to cumulative UV radiation exposure/sun exposure over time - diffuse scaly erythematous macules with underlying dyspigmentation - Recommend daily broad spectrum sunscreen SPF 30+ to sun-exposed areas, reapply every 2 hours as needed.  - Recommend staying in the shade or wearing long sleeves, sun glasses (UVA+UVB protection) and wide brim hats (4-inch brim around the entire circumference of the hat). - Call for new or changing lesions.  Return for appointment as scheduled.  Maylene Roes, CMA, am acting as scribe for Darden Dates, MD .  Documentation: I have reviewed the above documentation for accuracy and completeness, and I agree with the above.  Darden Dates, MD

## 2022-08-10 NOTE — Patient Instructions (Signed)
Due to recent changes in healthcare laws, you may see results of your pathology and/or laboratory studies on MyChart before the doctors have had a chance to review them. We understand that in some cases there may be results that are confusing or concerning to you. Please understand that not all results are received at the same time and often the doctors may need to interpret multiple results in order to provide you with the best plan of care or course of treatment. Therefore, we ask that you please give us 2 business days to thoroughly review all your results before contacting the office for clarification. Should we see a critical lab result, you will be contacted sooner.   If You Need Anything After Your Visit  If you have any questions or concerns for your doctor, please call our main line at [phone] and press option 4 to reach your doctor's medical assistant. If no one answers, please leave a voicemail as directed and we will return your call as soon as possible. Messages left after 4 pm will be answered the following business day.   You may also send us a message via MyChart. We typically respond to MyChart messages within 1-2 business days.  For prescription refills, please ask your pharmacy to contact our office. Our fax number is [phone].  If you have an urgent issue when the clinic is closed that cannot wait until the next business day, you can page your doctor at the number below.    Please note that while we do our best to be available for urgent issues outside of office hours, we are not available 24/7.   If you have an urgent issue and are unable to reach us, you may choose to seek medical care at your doctor's office, retail clinic, urgent care center, or emergency room.  If you have a medical emergency, please immediately call 911 or go to the emergency department.  Pager Numbers  - Dr. Kowalski: [phone]  - Dr. Moye: [phone]  - Dr. Stewart:  [phone]  In the event of inclement weather, please call our main line at [phone] for an update on the status of any delays or closures.  Dermatology Medication Tips: Please keep the boxes that topical medications come in in order to help keep track of the instructions about where and how to use these. Pharmacies typically print the medication instructions only on the boxes and not directly on the medication tubes.   If your medication is too expensive, please contact our office at [phone] option 4 or send us a message through MyChart.   We are unable to tell what your co-pay for medications will be in advance as this is different depending on your insurance coverage. However, we may be able to find a substitute medication at lower cost or fill out paperwork to get insurance to cover a needed medication.   If a prior authorization is required to get your medication covered by your insurance company, please allow us 1-2 business days to complete this process.  Drug prices often vary depending on where the prescription is filled and some pharmacies may offer cheaper prices.  The website www.goodrx.com contains coupons for medications through different pharmacies. The prices here do not account for what the cost may be with help from insurance (it may be cheaper with your insurance), but the website can give you the price if you did not use any insurance.  - You can print the associated coupon and take it with   your prescription to the pharmacy.  - You may also stop by our office during regular business hours and pick up a GoodRx coupon card.  - If you need your prescription sent electronically to a different pharmacy, notify our office through [place] MyChart or by phone at [phone] option 4.     Si Usted Necesita Algo Despus de Su Visita  Tambin puede enviarnos un mensaje a travs de MyChart. Por lo general respondemos a los mensajes de MyChart en el transcurso de 1 a 2  das hbiles.  Para renovar recetas, por favor pida a su farmacia que se ponga en contacto con nuestra oficina. Nuestro nmero de fax es el [phone].  Si tiene un asunto urgente cuando la clnica est cerrada y que no puede esperar hasta el siguiente da hbil, puede llamar/localizar a su doctor(a) al nmero que aparece a continuacin.   Por favor, tenga en cuenta que aunque hacemos todo lo posible para estar disponibles para asuntos urgentes fuera del horario de oficina, no estamos disponibles las 24 horas del da, los 7 das de la semana.   Si tiene un problema urgente y no puede comunicarse con nosotros, puede optar por buscar atencin mdica  en el consultorio de su doctor(a), en una clnica privada, en un centro de atencin urgente o en una sala de emergencias.  Si tiene una emergencia mdica, por favor llame inmediatamente al 911 o vaya a la sala de emergencias.  Nmeros de bper  - Dr. Kowalski: [phone]  - Dra. Moye: [phone]  - Dra. Stewart: [phone]  En caso de inclemencias del tiempo, por favor llame a nuestra lnea principal al [phone] para una actualizacin sobre el estado de cualquier retraso o cierre.  Consejos para la medicacin en dermatologa: Por favor, guarde las cajas en las que vienen los medicamentos de uso tpico para ayudarle a seguir las instrucciones sobre dnde y cmo usarlos. Las farmacias generalmente imprimen las instrucciones del medicamento slo en las cajas y no directamente en los tubos del medicamento.   Si su medicamento es muy caro, por favor, pngase en contacto con nuestra oficina llamando al [phone] y presione la opcin 4 o envenos un mensaje a travs de MyChart.   No podemos decirle cul ser su copago por los medicamentos por adelantado ya que esto es diferente dependiendo de la cobertura de su seguro. Sin embargo, es posible que podamos encontrar un medicamento sustituto a menor costo o llenar un formulario para que el  seguro cubra el medicamento que se considera necesario.   Si se requiere una autorizacin previa para que su compaa de seguros cubra su medicamento, por favor permtanos de 1 a 2 das hbiles para completar este proceso.  Los precios de los medicamentos varan con frecuencia dependiendo del lugar de dnde se surte la receta y alguna farmacias pueden ofrecer precios ms baratos.  El sitio web www.goodrx.com tiene cupones para medicamentos de diferentes farmacias. Los precios aqu no tienen en cuenta lo que podra costar con la ayuda del seguro (puede ser ms barato con su seguro), pero el sitio web puede darle el precio si no utiliz ningn seguro.  - Puede imprimir el cupn correspondiente y llevarlo con su receta a la farmacia.  - Tambin puede pasar por nuestra oficina durante el horario de atencin regular y recoger una tarjeta de cupones de GoodRx.  - Si necesita que su receta se enve electrnicamente a una farmacia diferente, informe a nuestra oficina a travs de MyChart de [place] [facility]   o por telfono llamando al [phone] y presione la opcin 4.  

## 2022-09-08 ENCOUNTER — Ambulatory Visit: Payer: Medicaid Other | Admitting: Dermatology

## 2022-09-08 ENCOUNTER — Encounter: Payer: Self-pay | Admitting: Dermatology

## 2022-09-08 DIAGNOSIS — L739 Follicular disorder, unspecified: Secondary | ICD-10-CM

## 2022-09-08 DIAGNOSIS — X32XXXA Exposure to sunlight, initial encounter: Secondary | ICD-10-CM

## 2022-09-08 DIAGNOSIS — D1801 Hemangioma of skin and subcutaneous tissue: Secondary | ICD-10-CM | POA: Diagnosis not present

## 2022-09-08 DIAGNOSIS — W908XXA Exposure to other nonionizing radiation, initial encounter: Secondary | ICD-10-CM

## 2022-09-08 DIAGNOSIS — L578 Other skin changes due to chronic exposure to nonionizing radiation: Secondary | ICD-10-CM

## 2022-09-08 DIAGNOSIS — L570 Actinic keratosis: Secondary | ICD-10-CM

## 2022-09-08 DIAGNOSIS — L821 Other seborrheic keratosis: Secondary | ICD-10-CM

## 2022-09-08 DIAGNOSIS — Z86018 Personal history of other benign neoplasm: Secondary | ICD-10-CM

## 2022-09-08 DIAGNOSIS — Z5111 Encounter for antineoplastic chemotherapy: Secondary | ICD-10-CM

## 2022-09-08 DIAGNOSIS — Z1283 Encounter for screening for malignant neoplasm of skin: Secondary | ICD-10-CM

## 2022-09-08 DIAGNOSIS — L814 Other melanin hyperpigmentation: Secondary | ICD-10-CM | POA: Diagnosis not present

## 2022-09-08 DIAGNOSIS — D229 Melanocytic nevi, unspecified: Secondary | ICD-10-CM

## 2022-09-08 DIAGNOSIS — B078 Other viral warts: Secondary | ICD-10-CM

## 2022-09-08 DIAGNOSIS — Z86006 Personal history of melanoma in-situ: Secondary | ICD-10-CM

## 2022-09-08 MED ORDER — CLINDAMYCIN PHOSPHATE 1 % EX GEL: Freq: Two times a day (BID) | CUTANEOUS | 5 refills | Status: AC

## 2022-09-08 MED ORDER — FLUOROURACIL 5 % EX CREA: TOPICAL_CREAM | Freq: Two times a day (BID) | CUTANEOUS | 0 refills | Status: AC

## 2022-09-08 NOTE — Progress Notes (Signed)
Follow-Up Visit   Subjective  Thomas [name] is a 57 y.o. male who presents for the following: Skin Cancer Screening and Full Body Skin Exam  The patient presents for Total-Body Skin Exam (TBSE) for skin cancer screening and mole check. The patient has spots, moles and lesions to be evaluated, some may be new or changing and the patient has concerns that these could be cancer.  Hx MMis, DN. Also with folliculitis at groin and axilla. Patient has clindamycin and ketoconazole but he is not sure what he's using, still with itching at groin.   The following portions of the chart were reviewed this encounter and updated as appropriate: medications, allergies, medical history  Review of Systems:  No other skin or systemic complaints except as noted in HPI or Assessment and Plan.  Objective  Well appearing patient in no apparent distress; mood and affect are within normal limits.  A full examination was performed including scalp, head, eyes, ears, nose, lips, neck, chest, axillae, abdomen, back, buttocks, bilateral upper extremities, bilateral lower extremities, hands, feet, fingers, toes, fingernails, and toenails. All findings within normal limits unless otherwise noted below.   Relevant physical exam findings are noted in the Assessment and Plan.  slightly hypertrophic at left sup shoulder x 1, left low back x 1 (2) Erythematous thin papules/macules with gritty scale.     Assessment & Plan   LENTIGINES, SEBORRHEIC KERATOSES, HEMANGIOMAS - Benign normal skin lesions - Benign-appearing - Call for any changes  MELANOCYTIC NEVI - Tan-brown and/or pink-flesh-colored symmetric macules and papules - Benign appearing on exam today - Observation - Call clinic for new or changing moles - Recommend daily use of broad spectrum spf 30+ sunscreen to sun-exposed areas.   ACTINIC DAMAGE - Chronic condition, secondary to cumulative UV/sun exposure - diffuse scaly erythematous macules with  underlying dyspigmentation - Recommend daily broad spectrum sunscreen SPF 30+ to sun-exposed areas, reapply every 2 hours as needed.  - Staying in the shade or wearing long sleeves, sun glasses (UVA+UVB protection) and wide brim hats (4-inch brim around the entire circumference of the hat) are also recommended for sun protection.  - Call for new or changing lesions.  SKIN CANCER SCREENING PERFORMED TODAY.  History of Dysplastic Nevi - Moderate atypia, mid back right midline lateral  - No evidence of recurrence today - Recommend regular full body skin exams - Recommend daily broad spectrum sunscreen SPF 30+ to sun-exposed areas, reapply every 2 hours as needed.  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  - Call if any new or changing lesions are noted between office visits  FOLLICULITIS Exam: Perifollicular erythematous papules and pustules at groin  Treatment Plan: Restart clindamycin gel to affected areas at groin one to two times daily as needed.  Recommend [facility] hypochlorous spray daily or over the counter Hibaclens wash.  Actinic skin damage  AK (actinic keratosis) (2) slightly hypertrophic at left sup shoulder x 1, left low back x 1  Actinic keratoses are precancerous spots that appear secondary to cumulative UV radiation exposure/sun exposure over time. They are chronic with expected duration over 1 year. A portion of actinic keratoses will progress to squamous cell carcinoma of the  skin. It is not possible to reliably predict which spots will progress to skin cancer and so treatment is recommended to prevent development of skin cancer.  Recommend daily broad spectrum sunscreen SPF 30+  to sun-exposed areas, reapply every 2 hours as needed.  Recommend staying in the shade or wearing long sleeves, sun glasses (UVA+UVB protection) and wide brim hats (4-inch brim around the entire circumference of the hat). Call for new or changing lesions.  Prior to procedure, discussed risks of blister formation, small wound, skin dyspigmentation, or rare scar following cryotherapy. Recommend Vaseline ointment to treated areas while healing.   Destruction of lesion - slightly hypertrophic at left sup shoulder x 1, left low back x 1  Destruction method: cryotherapy   Informed consent: discussed and consent obtained   Lesion destroyed using liquid nitrogen: Yes   Cryotherapy cycles:  2 Outcome: patient tolerated procedure well with no complications   Post-procedure details: wound care instructions given     ACTINIC DAMAGE WITH PRECANCEROUS ACTINIC KERATOSES Counseling for Topical Chemotherapy Management: Patient exhibits: - Severe, confluent actinic changes with pre-cancerous actinic keratoses that is secondary to cumulative UV radiation exposure over time - Condition that is severe; chronic, not at goal. - diffuse scaly erythematous macules and papules with underlying dyspigmentation - Discussed Prescription "Field Treatment" topical Chemotherapy for Severe, Chronic Confluent Actinic Changes with Pre-Cancerous Actinic Keratoses Field treatment involves treatment of an entire area of skin that has confluent Actinic Changes (Sun/ Ultraviolet light damage) and PreCancerous Actinic Keratoses by method of PhotoDynamic Therapy (PDT) and/or prescription Topical Chemotherapy agents such as 5-fluorouracil, 5-fluorouracil/calcipotriene, and/or imiquimod.  The purpose is to decrease the number of clinically evident and subclinical PreCancerous lesions to prevent progression to development of skin cancer by chemically destroying early precancer changes that may or may not be visible.  It has been shown to  reduce the risk of developing skin cancer in the treated area. As a result of treatment, redness, scaling, crusting, and open sores may occur during treatment course. One or more than one of these methods may be used and may have to be used several times to control, suppress and eliminate the PreCancerous changes. Discussed treatment course, expected reaction, and possible side effects. - Recommend daily broad spectrum sunscreen SPF 30+ to sun-exposed areas, reapply every 2 hours as needed.  - Staying in the shade or wearing long sleeves, sun glasses (UVA+UVB protection) and wide brim hats (4-inch brim around the entire circumference of the hat) are also recommended. - Call for new or changing lesions. - Plan 5FU to arms and face in the fall - Start 5-fluorouracil cream twice a day for 7 days to affected areas including face, twice a day for 14 days to arms (one at a time).  Reviewed course of treatment and expected reaction.  Patient advised to expect inflammation and crusting and advised that erosions are possible.  Patient advised to be diligent with sun protection during and after treatment. Handout with details of how to apply medication and what to expect provided. Counseled to keep medication out of reach of children and pets.  WART Exam: verrucous papule(s)   Counseling Discussed viral / HPV (Human Papilloma Virus) etiology and risk of spread /infectivity to other areas of body as well as to other people.  Multiple treatments and methods may be required to clear warts and it is possible treatment may not be successful.  Treatment risks include discoloration; scarring and there is still potential for wart recurrence.  Treatment  Plan: Destruction Procedure Note Destruction method: cryotherapy   Informed consent: discussed and consent obtained   Lesion destroyed using liquid nitrogen: Yes   Outcome: patient tolerated procedure well with no complications   Post-procedure details: wound care  instructions given   Locations: left mid back # of Lesions Treated: 1  Prior to procedure, discussed risks of blister formation, small wound, skin dyspigmentation, or rare scar following cryotherapy. Recommend Vaseline ointment to treated areas while healing.   Return in about 4 months (around 01/09/2023) for TBSE, Hx MMis, Hx Dysplastic Nevi, Hx AK.  Anise Salvo, RMA, am acting as scribe for Darden Dates, MD .   Documentation: I have reviewed the above documentation for accuracy and completeness, and I agree with the above.  Darden Dates, MD

## 2022-09-08 NOTE — Patient Instructions (Addendum)
[facility] Dr. Donzetta Starch or Dr. Rosalee Kaufman [address] [place]  [address] [phone]  For groin - Restart clindamycin gel to affected areas at groin one to two times daily as needed.  Recommend [facility] hypochlorous spray daily or over the counter Hibaclens wash.  In the fall - - Start 5-fluorouracil cream twice a day for 7 days to affected areas including face, twice a day for 14 days to arms (one at a time).  Reviewed course of treatment and expected reaction.  Patient advised to expect inflammation and crusting and advised that erosions are possible.  Patient advised to be diligent with sun protection during and after treatment. Handout with details of how to apply medication and what to expect provided. Counseled to keep medication out of reach of children and pets.  5-Fluorouracil Patient Education   Actinic keratoses are the dry, red scaly spots on the skin caused by sun damage. A portion of these spots can turn into skin cancer with time, and treating them can help prevent development of skin cancer.   Treatment of these spots requires removal of the defective skin cells. There are various ways to remove actinic keratoses, including freezing with liquid nitrogen, treatment with creams, or treatment with a blue light procedure in the office.   5-fluorouracil cream is a topical cream used to treat actinic keratoses. It works by interfering with the growth of abnormal fast-growing skin cells, such as actinic keratoses. These cells peel off and are replaced by healthy ones. THIS CREAM SHOULD BE KEPT OUT OF REACH OF CHILDREN AND PETS AND SHOULD NOT BE USED BY PREGNANT WOMEN.  INSTRUCTIONS FOR 5-FLUOROURACIL CREAM:   5-fluorouracil cream typically needs to be used for 7-14 days depending on the location of treatment. A thin layer should be applied twice a day to the treatment areas recommended by your physician.    Avoid contact with your eyes or  nostrils. Avoid applying the cream to your eyelids or lips unless directed to apply there by your physician. Do not use 5-fluorouracil on infected or open wounds.   You will develop redness, irritation and some crusting at areas where you have pre-cancer damage/actinic keratoses. IF YOU DEVELOP PAIN, BLEEDING, OR SIGNIFICANT CRUSTING, STOP THE TREATMENT EARLY - you have already gotten a good response and the actinic keratoses should clear up well.  Wash your hands after applying 5-fluorouracil 5% cream on your skin.   A moisturizer or sunscreen with a minimum SPF 30 should be applied each morning.   Once you have finished the treatment, you can apply a thin layer of Vaseline twice a day to irritated areas to soothe and calm the areas more quickly. If you experience significant discomfort, contact your physician.  For some patients it is necessary to repeat the treatment for best results.  SIDE EFFECTS: When using 5-fluorouracil cream, you may have mild irritation, such as redness, dryness, swelling, or a mild burning sensation. This usually resolves within 2 weeks. The more actinic keratoses you have, the more redness and inflammation you can expect during treatment. Eye irritation has been reported rarely. If this occurs, please let us know.   If you have any trouble using this cream, please send [place] a MyChart Message or call the office. If you have any other questions about this information, please do not hesitate to ask me before you leave the office or reach out on MyChart or by phone.  Your prescription was sent to [facility] in [place].  A representative from [facility] will contact you within 2 business hours to verify your address and insurance information to schedule a free delivery. If for any reason you do not receive a phone call from them, please reach out to them. Their phone number is [phone] and their hours are Monday-Friday 9:00 am-5:00 pm.    Recommend taking  Heliocare sun protection supplement daily in sunny weather for additional sun protection. For maximum protection on the sunniest days, you can take up to 2 capsules of regular Heliocare OR take 1 capsule of Heliocare Ultra. For prolonged exposure (such as a full day in the sun), you can repeat your dose of the supplement 4 hours after your first dose. Heliocare can be purchased at [employer], at some [facility] or at [email or web address].    Melanoma ABCDEs  Melanoma is the most dangerous type of skin cancer, and is the leading cause of death from skin disease.  You are more likely to develop melanoma if you: Have light-colored skin, light-colored eyes, or red or blond hair Spend a lot of time in the sun Tan regularly, either outdoors or in a tanning bed Have had blistering sunburns, especially during childhood Have a close family member who has had a melanoma Have atypical moles or large birthmarks  Early detection of melanoma is key since treatment is typically straightforward and cure rates are extremely high if we catch it early.   The first sign of melanoma is often a change in a mole or a new dark spot.  The ABCDE system is a way of remembering the signs of melanoma.  A for asymmetry:  The two halves do not match. B for border:  The edges of the growth are irregular. C for color:  A mixture of colors are present instead of an even brown color. D for diameter:  Melanomas are usually (but not always) greater than 6mm - the size of a pencil eraser. E for evolution:  The spot keeps changing in size, shape, and color.  Please check your skin once per month between visits. You can use a small mirror in front and a large mirror behind you to keep an eye on the back side or your body.   If you see any new or changing lesions before your next follow-up, please call to schedule a visit.  Please continue daily skin protection including broad spectrum sunscreen SPF 30+ to sun-exposed areas,  reapplying every 2 hours as needed when you're outdoors.    Due to recent changes in healthcare laws, you may see results of your pathology and/or laboratory studies on MyChart before the doctors have had a chance to review them. We understand that in some cases there may be results that are confusing or concerning to you. Please understand that not all results are received at the same time and often the doctors may need to interpret multiple results in order to provide you with the best plan of care or course of treatment. Therefore, we ask that you please give [place] 2 business days to thoroughly review all your results before contacting the office for clarification. Should we see a critical lab result, you will be contacted sooner.   If You Need Anything After Your Visit  If you have any questions or concerns for your doctor, please call our main line at [phone] and press option 4 to reach your doctor's medical assistant. If no one answers, please leave a voicemail as directed and we will return your  call as soon as possible. Messages left after 4 pm will be answered the following business day.   You may also send [place] a message via MyChart. We typically respond to MyChart messages within 1-2 business days.  For prescription refills, please ask your pharmacy to contact our office. Our fax number is [phone].  If you have an urgent issue when the clinic is closed that cannot wait until the next business day, you can page your doctor at the number below.    Please note that while we do our best to be available for urgent issues outside of office hours, we are not available 24/7.   If you have an urgent issue and are unable to reach [place], you may choose to seek medical care at your doctor's office, retail clinic, urgent care center, or emergency room.  If you have a medical emergency, please immediately call 911 or go to the emergency department.  Pager Numbers  - Dr. Gwen Pounds:  [phone]  - Dr. Neale Burly: [phone]  - Dr. Roseanne Reno: [phone]  In the event of inclement weather, please call our main line at [phone] for an update on the status of any delays or closures.  Dermatology Medication Tips: Please keep the boxes that topical medications come in in order to help keep track of the instructions about where and how to use these. Pharmacies typically print the medication instructions only on the boxes and not directly on the medication tubes.   If your medication is too expensive, please contact our office at [phone] option 4 or send [place] a message through MyChart.   We are unable to tell what your co-pay for medications will be in advance as this is different depending on your insurance coverage. However, we may be able to find a substitute medication at lower cost or fill out paperwork to get insurance to cover a needed medication.   If a prior authorization is required to get your medication covered by your insurance company, please allow [place] 1-2 business days to complete this process.  Drug prices often vary depending on where the prescription is filled and some pharmacies may offer cheaper prices.  The website www.goodrx.com contains coupons for medications through different pharmacies. The prices here do not account for what the cost may be with help from insurance (it may be cheaper with your insurance), but the website can give you the price if you did not use any insurance.  - You can print the associated coupon and take it with your prescription to the pharmacy.  - You may also stop by our office during regular business hours and pick up a GoodRx coupon card.  - If you need your prescription sent electronically to a different pharmacy, notify our office through [facility] or by phone at [phone] option 4.

## 2022-10-03 ENCOUNTER — Other Ambulatory Visit: Payer: Self-pay

## 2022-10-03 MED ORDER — CLINDAMYCIN PHOSPHATE 1 % EX SOLN: Freq: Two times a day (BID) | CUTANEOUS | 11 refills | Status: AC

## 2023-01-09 ENCOUNTER — Ambulatory Visit: Payer: Medicaid Other | Admitting: Dermatology

## 2023-07-25 ENCOUNTER — Other Ambulatory Visit: Payer: Self-pay | Admitting: Acute Care

## 2023-07-25 DIAGNOSIS — Z87891 Personal history of nicotine dependence: Secondary | ICD-10-CM

## 2023-08-02 ENCOUNTER — Ambulatory Visit
Admission: RE | Admit: 2023-08-02 | Discharge: 2023-08-02 | Discharge status: Home / Self Care | Source: Ambulatory Visit | Attending: Physician Assistant | Admitting: Physician Assistant

## 2023-08-02 DIAGNOSIS — Z87891 Personal history of nicotine dependence: Secondary | ICD-10-CM

## 2023-08-03 ENCOUNTER — Inpatient Hospital Stay: Admission: RE | Admit: 2023-08-03 | Source: Ambulatory Visit

## 2023-09-01 ENCOUNTER — Other Ambulatory Visit: Payer: Self-pay | Admitting: Acute Care

## 2023-09-01 DIAGNOSIS — Z87891 Personal history of nicotine dependence: Secondary | ICD-10-CM

## 2023-09-01 DIAGNOSIS — Z122 Encounter for screening for malignant neoplasm of respiratory organs: Secondary | ICD-10-CM
